# Patient Record
Sex: Female | Born: 1955 | Race: White | Hispanic: No | Smoking: Never smoker
Health system: Southern US, Community
[De-identification: ages and names within clinical notes are randomized; demographics above are authoritative.]

## PROBLEM LIST (undated history)

## (undated) DIAGNOSIS — R32 Unspecified urinary incontinence: Secondary | ICD-10-CM

## (undated) DIAGNOSIS — K635 Polyp of colon: Secondary | ICD-10-CM

## (undated) DIAGNOSIS — F319 Bipolar disorder, unspecified: Secondary | ICD-10-CM

## (undated) DIAGNOSIS — R7303 Prediabetes: Secondary | ICD-10-CM

## (undated) DIAGNOSIS — H269 Unspecified cataract: Secondary | ICD-10-CM

## (undated) DIAGNOSIS — M1990 Unspecified osteoarthritis, unspecified site: Secondary | ICD-10-CM

## (undated) DIAGNOSIS — I82409 Acute embolism and thrombosis of unspecified deep veins of unspecified lower extremity: Secondary | ICD-10-CM

## (undated) DIAGNOSIS — A630 Anogenital (venereal) warts: Secondary | ICD-10-CM

## (undated) DIAGNOSIS — K219 Gastro-esophageal reflux disease without esophagitis: Secondary | ICD-10-CM

## (undated) DIAGNOSIS — Z8371 Family history of colonic polyps: Secondary | ICD-10-CM

## (undated) DIAGNOSIS — F419 Anxiety disorder, unspecified: Secondary | ICD-10-CM

## (undated) DIAGNOSIS — A6000 Herpesviral infection of urogenital system, unspecified: Secondary | ICD-10-CM

## (undated) DIAGNOSIS — Z8719 Personal history of other diseases of the digestive system: Secondary | ICD-10-CM

## (undated) DIAGNOSIS — K5792 Diverticulitis of intestine, part unspecified, without perforation or abscess without bleeding: Secondary | ICD-10-CM

## (undated) DIAGNOSIS — C55 Malignant neoplasm of uterus, part unspecified: Secondary | ICD-10-CM

## (undated) DIAGNOSIS — F32A Depression, unspecified: Secondary | ICD-10-CM

## (undated) DIAGNOSIS — F329 Major depressive disorder, single episode, unspecified: Secondary | ICD-10-CM

## (undated) HISTORY — DX: Unspecified cataract: H26.9

## (undated) HISTORY — DX: Unspecified osteoarthritis, unspecified site: M19.90

## (undated) HISTORY — DX: Depression, unspecified: F32.A

## (undated) HISTORY — PX: COLONOSCOPY: SHX174

## (undated) HISTORY — PX: BUNIONECTOMY: SHX129

## (undated) HISTORY — PX: COLONOSCOPY W/ POLYPECTOMY: SHX1380

## (undated) HISTORY — DX: Bipolar disorder, unspecified: F31.9

## (undated) HISTORY — DX: Malignant neoplasm of uterus, part unspecified: C55

## (undated) HISTORY — DX: Unspecified urinary incontinence: R32

## (undated) HISTORY — DX: Anogenital (venereal) warts: A63.0

## (undated) HISTORY — DX: Anxiety disorder, unspecified: F41.9

## (undated) HISTORY — DX: Diverticulitis of intestine, part unspecified, without perforation or abscess without bleeding: K57.92

## (undated) HISTORY — PX: ABDOMINAL HYSTERECTOMY: SHX81

## (undated) HISTORY — DX: Family history of colonic polyps: Z83.71

## (undated) HISTORY — PX: TONSILLECTOMY AND ADENOIDECTOMY: SUR1326

## (undated) HISTORY — PX: KNEE ARTHROSCOPY: SUR90

## (undated) HISTORY — PX: OTHER SURGICAL HISTORY: SHX169

---
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
## 2011-12-14 DIAGNOSIS — N870 Mild cervical dysplasia: Secondary | ICD-10-CM

## 2011-12-14 HISTORY — DX: Mild cervical dysplasia: N87.0

## 2017-10-09 DIAGNOSIS — F3175 Bipolar disorder, in partial remission, most recent episode depressed: Secondary | ICD-10-CM | POA: Diagnosis not present

## 2017-11-14 DIAGNOSIS — Z1231 Encounter for screening mammogram for malignant neoplasm of breast: Secondary | ICD-10-CM | POA: Diagnosis not present

## 2017-12-05 DIAGNOSIS — Z1151 Encounter for screening for human papillomavirus (HPV): Secondary | ICD-10-CM | POA: Diagnosis not present

## 2017-12-05 DIAGNOSIS — A6000 Herpesviral infection of urogenital system, unspecified: Secondary | ICD-10-CM | POA: Diagnosis not present

## 2017-12-05 DIAGNOSIS — Z01419 Encounter for gynecological examination (general) (routine) without abnormal findings: Secondary | ICD-10-CM | POA: Diagnosis not present

## 2017-12-05 DIAGNOSIS — Z6829 Body mass index (BMI) 29.0-29.9, adult: Secondary | ICD-10-CM | POA: Diagnosis not present

## 2017-12-05 DIAGNOSIS — R87810 Cervical high risk human papillomavirus (HPV) DNA test positive: Secondary | ICD-10-CM | POA: Diagnosis not present

## 2017-12-11 DIAGNOSIS — Z131 Encounter for screening for diabetes mellitus: Secondary | ICD-10-CM | POA: Diagnosis not present

## 2017-12-11 DIAGNOSIS — R7989 Other specified abnormal findings of blood chemistry: Secondary | ICD-10-CM | POA: Diagnosis not present

## 2017-12-11 DIAGNOSIS — Z1329 Encounter for screening for other suspected endocrine disorder: Secondary | ICD-10-CM | POA: Diagnosis not present

## 2017-12-11 DIAGNOSIS — Z130 Encounter for screening for diseases of the blood and blood-forming organs and certain disorders involving the immune mechanism: Secondary | ICD-10-CM | POA: Diagnosis not present

## 2017-12-11 DIAGNOSIS — Z0000 Encounter for general adult medical examination without abnormal findings: Secondary | ICD-10-CM | POA: Diagnosis not present

## 2017-12-11 DIAGNOSIS — Z13220 Encounter for screening for lipoid disorders: Secondary | ICD-10-CM | POA: Diagnosis not present

## 2018-03-19 DIAGNOSIS — F3175 Bipolar disorder, in partial remission, most recent episode depressed: Secondary | ICD-10-CM | POA: Diagnosis not present

## 2018-05-03 DIAGNOSIS — M79641 Pain in right hand: Secondary | ICD-10-CM | POA: Diagnosis not present

## 2018-05-03 DIAGNOSIS — M545 Low back pain: Secondary | ICD-10-CM | POA: Diagnosis not present

## 2018-05-03 DIAGNOSIS — M25571 Pain in right ankle and joints of right foot: Secondary | ICD-10-CM | POA: Diagnosis not present

## 2018-05-03 DIAGNOSIS — M76821 Posterior tibial tendinitis, right leg: Secondary | ICD-10-CM | POA: Diagnosis not present

## 2018-06-03 DIAGNOSIS — M25571 Pain in right ankle and joints of right foot: Secondary | ICD-10-CM | POA: Diagnosis not present

## 2018-06-03 DIAGNOSIS — Z6829 Body mass index (BMI) 29.0-29.9, adult: Secondary | ICD-10-CM | POA: Diagnosis not present

## 2018-06-03 DIAGNOSIS — E663 Overweight: Secondary | ICD-10-CM | POA: Diagnosis not present

## 2018-06-03 DIAGNOSIS — Z713 Dietary counseling and surveillance: Secondary | ICD-10-CM | POA: Diagnosis not present

## 2018-06-03 DIAGNOSIS — E039 Hypothyroidism, unspecified: Secondary | ICD-10-CM | POA: Diagnosis not present

## 2018-06-03 DIAGNOSIS — Z0000 Encounter for general adult medical examination without abnormal findings: Secondary | ICD-10-CM | POA: Diagnosis not present

## 2018-07-23 ENCOUNTER — Ambulatory Visit: Payer: BLUE CROSS/BLUE SHIELD | Admitting: Podiatry

## 2018-07-23 ENCOUNTER — Ambulatory Visit (INDEPENDENT_AMBULATORY_CARE_PROVIDER_SITE_OTHER): Payer: BLUE CROSS/BLUE SHIELD

## 2018-07-23 DIAGNOSIS — M2142 Flat foot [pes planus] (acquired), left foot: Secondary | ICD-10-CM | POA: Diagnosis not present

## 2018-07-23 DIAGNOSIS — M76821 Posterior tibial tendinitis, right leg: Secondary | ICD-10-CM | POA: Diagnosis not present

## 2018-07-23 DIAGNOSIS — M2141 Flat foot [pes planus] (acquired), right foot: Secondary | ICD-10-CM | POA: Diagnosis not present

## 2018-07-23 DIAGNOSIS — M779 Enthesopathy, unspecified: Secondary | ICD-10-CM

## 2018-07-23 NOTE — Patient Instructions (Signed)
Posterior Tibialis Tendinosis Rehab Ask your health care provider which exercises are safe for you. Do exercises exactly as told by your health care provider and adjust them as directed. It is normal to feel mild stretching, pulling, tightness, or discomfort as you do these exercises, but you should stop right away if you feel sudden pain or your pain gets worse.Do not begin these exercises until told by your health care provider. Stretching and range of motion exercises These exercises warm up your muscles and joints and improve the movement and flexibility in your ankle and foot. These exercises may also help to relieve pain. Exercise A: Standing wall calf stretch, knee straight  1. Stand with your hands against a wall. 2. Extend your __________ leg behind you, and bend your front knee slightly. Keep both of your heels on the floor. 3. Point the toes of your back foot slightly inward. 4. Keeping your heels on the floor and your back knee straight, shift your weight toward the wall. Do not allow your back to arch. You should feel a gentle stretch in the back of your lower leg (calf). 5. Hold this position for __________ seconds. Repeat __________ times. Complete this stretch __________ times a day. Exercise B: Standing wall calf stretch, knee bent 1. Stand with your hands against a wall. 2. Extend your __________ leg behind you, and bend your front knee slightly. Keep both of your heels on the floor. 3. Point the toes of your back foot slightly inward. 4. Unlock your back knee so it is bent. Keep your heels on the floor. You should feel a gentle stretch deep in your calf. 5. Hold this position for __________ seconds. Repeat __________ times. Complete this exercise __________ times a day. Strengthening exercises These exercises build strength and endurance in your ankle and foot. Endurance is the ability to use your muscles for a long time, even after they get tired. Exercise C: Ankle inversion  with band 1. Secure one end of an exercise band or tubing to a fixed object, such as a table leg or a pole, that will stay still when the band is pulled. to an object that will not move if it is pulled on, like a table leg. 2. Loop the other end of the band around the middle of your left / right foot. 3. Sit on the floor facing the object with your __________ leg extended. The band or tube should be slightly tense when your foot is relaxed. 4. Leading with your big toe, slowly bring your __________ foot and ankle inward, toward your other foot. 5. Hold this position for __________ seconds. 6. Slowly return your foot to the starting position. Repeat __________ times. Complete this exercise __________ times a day. Exercise D: Towel curls  1. Sit in a chair on a non-carpeted surface, and put your feet on the floor. 2. Place a towel in front of your feet. If told by your health care provider, add __________ at the end of the towel. 3. Keeping your heel on the floor, put your __________ foot on the towel. 4. Pull the towel toward you by grabbing the towel with your toes and curling them under. Keep your heel on the floor. 5. Let your toes relax. 6. Grab the towel with your toes again. Keep going until the towel is completely underneath your foot. Repeat __________ times. Complete this exercise __________ times a day. Balance exercises These exercises improve or maintain your balance. Balance is important in preventing falls.  Exercise E: Single leg stand 1. Without shoes, stand near a railing or in a doorway. You can hold on to the railing or door frame as needed for balance. 2. Stand on your __________ foot. Keep your big toe down on the floor and try to keep your arch lifted. If balancing in this position is too easy, try the exercise with your eyes closed or while standing on a pillow. 3. Hold this position for __________ seconds. Repeat __________ times. Complete this exercise __________ times a  day. This information is not intended to replace advice given to you by your health care provider. Make sure you discuss any questions you have with your health care provider. Document Released: 10/30/2005 Document Revised: 07/04/2016 Document Reviewed: 07/16/2015 Elsevier Interactive Patient Education  [employer].

## 2018-07-24 DIAGNOSIS — M76821 Posterior tibial tendinitis, right leg: Secondary | ICD-10-CM | POA: Insufficient documentation

## 2018-07-24 NOTE — Progress Notes (Signed)
Subjective:   Patient ID: Summer Craig, female   DOB: 62 y.o.   MRN: [id number]   HPI 62 year old female presents the office with concerns of right ankle, foot pain which is been ongoing for last several months.  She previously saw another physician for this and she was put into a walking boot as well as anti-inflammatories which did help however she feels that the problem does continue to some degree the area is not fixed.  She states that she gets pain mostly into most of her ankle, pointing more along the navicular tuberosity.  She gets occasional swelling to the area.  She denies any recent injury or trauma to her feet.  She is been resting, icing and elevating as well and this is been helping with the swelling.  She has no numbness or tingling.  She has no other concerns today.   Review of Systems  All other systems reviewed and are negative.  No past medical history on file.     Current Outpatient Medications:  .  clorazepate (TRANXENE-T) 7.5 MG tablet, Take by mouth., Disp: , Rfl:  .  doxycycline (ORACEA) 40 MG capsule, TK 1 C PO D, Disp: , Rfl:  .  meperidine (DEMEROL) 50 MG tablet, Take by mouth., Disp: , Rfl:  .  metroNIDAZOLE (METROCREAM) 0.75 % cream, Apply to entire face qhs, Disp: , Rfl:  .  promethazine (PHENERGAN) 25 MG tablet, Take by mouth., Disp: , Rfl:  .  fluvoxaMINE (LUVOX) 50 MG tablet, TK 2 TS PO QAM AND 1 T QPM, Disp: , Rfl: 1 .  folic acid (FOLVITE) 1 MG tablet, TK 1 T PO D, Disp: , Rfl: 2 .  lamoTRIgine (LAMICTAL) 200 MG tablet, TK 1 T PO ONCE A DAY, Disp: , Rfl: 1 .  meloxicam (MOBIC) 15 MG tablet, TK 1 T PO QD WF PRN, Disp: , Rfl: 0 .  metFORMIN (GLUCOPHAGE-XR) 500 MG 24 hr tablet, TAKE ONE TABLET BID, Disp: , Rfl: 2 .  OLANZapine (ZYPREXA) 2.5 MG tablet, TK 1 T PO Q NIGHT, Disp: , Rfl: 0 .  OLANZapine (ZYPREXA) 5 MG tablet, Take by mouth., Disp: , Rfl:   Allergies  Allergen Reactions  . No Known Allergies           Objective:  Physical Exam   General: AAO x3, NAD  Dermatological: Skin is warm, dry and supple bilateral. Nails x 10 are well manicured; remaining integument appears unremarkable at this time. There are no open sores, no preulcerative lesions, no rash or signs of infection present.  Vascular: Dorsalis Pedis artery and Posterior Tibial artery pedal pulses are 2/4 bilateral with immedate capillary fill time. Pedal hair growth present. No varicosities and no lower extremity edema present bilateral. There is no pain with calf compression, swelling, warmth, erythema.   Neruologic: Grossly intact via light touch bilateral.  Protective threshold with Semmes Wienstein monofilament intact to all pedal sites bilateral.  Negative Tinel sign.  Musculoskeletal: There is a decrease in medial arch upon weightbearing.  There is tenderness to the distal portion of the posterior tibial tendon along insertion into the navicular tuberosity.  Tendon appears to be intact.  She has some difficulty with single heel rise on the right side but she is able to do it.  There is no pain or restriction of ankle and subtalar joint range of motion.  Achilles tendon, flexor, extensor tendons intact.  Muscular strength 5/5 in all groups tested bilateral.  Gait: Unassisted, Nonantalgic.  Assessment:   Posterior tibial tendinitis, flatfoot deformity     Plan:  -Treatment options discussed including all alternatives, risks, and complications -Etiology of symptoms were discussed -X-rays were obtained and reviewed with the patient.  Evidence of flatfoot deformity is present.  There is no evidence of acute fracture identified today. -At this point it appears that the acute inflammation is improving.  We discussed more long-term options for her.  In the short-term I want her to continue with stretching, rehab exercises we discussed as well as anti-inflammatories and ice the area.  Long-term I think is in the benefit well from a custom molded orthotic and  she wished to proceed with this.  Rick evaluate her today she was molded for inserts.  We discussed shoe modifications as well.  She agrees with this plan has no further questions or concerns.  If symptoms continue discussed MRI.  Trula Slade DPM

## 2018-07-25 ENCOUNTER — Encounter: Payer: Self-pay | Admitting: Registered"

## 2018-07-25 ENCOUNTER — Encounter: Payer: BLUE CROSS/BLUE SHIELD | Attending: Obstetrics | Admitting: Registered"

## 2018-07-25 DIAGNOSIS — Z6829 Body mass index (BMI) 29.0-29.9, adult: Secondary | ICD-10-CM | POA: Insufficient documentation

## 2018-07-25 DIAGNOSIS — E663 Overweight: Secondary | ICD-10-CM | POA: Diagnosis not present

## 2018-07-25 DIAGNOSIS — Z713 Dietary counseling and surveillance: Secondary | ICD-10-CM | POA: Diagnosis not present

## 2018-07-25 NOTE — Progress Notes (Signed)
Medical Nutrition Therapy:  Appt start time: 0920 end time:  1025.  Assessment:  Primary concerns today: Pt referred for weight management. Pt reports she is on Metformin to help reduce weight gain associated with other medications she takes as well as help with managing blood sugar per pt. Pt reports that she has been making some dietary changes recently. Reports drinking at least 64 oz of water per day, eating 3 meals per day, reducing portions, and working to have more balanced meals. Pt has been including more yogurt and having sweets in moderation. Pt has lost around 10 lb over a little over the past month. Pt reports she has started being more thoughtful about what she eats. Pt reports that eating a lot of bread, pasta causes her stomach to feel "on fire" as well as causing increased acid reflux. She reports that whether or not she has this outcome depends on amount of bread or pasta she consumes. Pt reports having increased acid reflux after drinking wine as well. Denies having this feeling with any other types of food. Pt denies any concerns about her energy level. Reports she has plenty of energy.   Preferred Learning Style:   No preference indicated   Learning Readiness:   Ready  MEDICATIONS: See list.    DIETARY INTAKE:  Usual eating pattern includes 3 meals and may have 1 snack per day. Meals eaten at home are eaten either in kitchen or sunroom and no electronics are present at meal times.   Everyday foods include Honey Nut Cheerios and 2% milk.  Avoided foods include [place] yogurt (likes regular yogurt). Pt reports that she does not like low or fat free milk.   24-hr recall: Typical Day  B ( AM): Honey Nut Cheerios, 2% milk, 1 cup of coffee  Snk ( AM): 32 oz water  L ( PM): salad with vinaigrette OR ice burg lettuce, blue cheese, blue cheese dressing, grapes.   Snk ( PM): may sometimes have yogurt; 32 oz water D (630-730 PM): sauteed vegetables, a meat (often chicken or  seafood)-no starch usually  Snk ( PM): small serving of ice cream  Beverages: 64 oz water  Usual physical activity:  Pt reports she has not been walking for activity due to tendonitis in foot. Pt has seen a podiatrist. When asked about doing chair exercises pt responded that she wants to start back taking chair yoga and was very agreeable to trying chair exercises.   Progress Towards Goal(s):  In progress.   Nutritional Diagnosis:  NB-1.1 Food and nutrition-related knowledge deficit As related to balanced nutrition.  As evidenced by pt has questions regarding how she should eat to promote health/weight loss; pt's reported dietary recall and habits.    Intervention:  Nutrition counseling provided. Dietitian provided education regarding balanced nutrition. Praised pt's progress with getting in more consistent meals, being more mindful of dietary intake, and staying well hydrated. Dietitian provided education regarding celiac disease/gluten intolerance. Discussed gluten free grains as pt reports that wheat bread and pasta cause GI pain/reflux. Encouraged pt to talk with her doctor about being tested for celiac disease. Pt reports that she would be willing to have blood work done but is not sure about having an endoscopy if it was recommended. Pt reports she would be willing to follow a gluten-free diet if doing so helps with reducing her symptoms. Encouraged pt to talk with doctor about test recommendations first as tests may not be accurate if pt starts following a gluten  free diet beforehand. Encouraged her to follow-up if she has any questions about following a gluten free diet. Encouraged pt to try chair exercises while unable to include walking due to foot pain. Pt appeared agreeable to information/goals discussed.   Instructions/Goals:  Make sure to get in three meals per day. Have balanced meals like the My Plate example (see handout).  Continue getting in at least 64 oz of water per day.    Make physical activity a part of your week. Try to include at least 30 minutes of physical activity 5 days each week or at least 150 minutes per week. Regular physical activity promotes overall health-including helping to reduce risk for heart disease and diabetes, promoting mental health, and helping [place] sleep better.    Include appropriate physical activities as able. Recommend including the chair activities while unable to include walking.   Recommend asking your doctor about testing for celiac disease due to described adverse symptoms following ingestion of wheat products (bread and pasta).   For more information about celiac disease:   [email or web address]  Teaching Method Utilized: Visual Auditory  Handouts given during visit include:  Balanced plate and food list.   Chair Exercises   Barriers to learning/adherence to lifestyle change: None indicated.   Demonstrated degree of understanding via:  Teach Back   Monitoring/Evaluation:  Dietary intake, exercise, and body weight prn. Contact information given.

## 2018-07-25 NOTE — Patient Instructions (Addendum)
Instructions/Goals:  Make sure to get in three meals per day. Have balanced meals like the My Plate example (see handout).  Continue getting in at least 64 oz of water per day.   Make physical activity a part of your week. Try to include at least 30 minutes of physical activity 5 days each week or at least 150 minutes per week. Regular physical activity promotes overall health-including helping to reduce risk for heart disease and diabetes, promoting mental health, and helping [place] sleep better.    Include appropriate physical activities as able. Recommend including the chair activities while unable to include walking.   Recommend asking your doctor about testing for celiac disease due to described adverse symptoms following ingestion of wheat products (bread and pasta).   For more information about celiac disease:   [email or web address]

## 2018-08-13 ENCOUNTER — Encounter: Payer: BLUE CROSS/BLUE SHIELD | Admitting: Orthotics

## 2018-08-19 DIAGNOSIS — H43393 Other vitreous opacities, bilateral: Secondary | ICD-10-CM | POA: Diagnosis not present

## 2018-08-20 ENCOUNTER — Encounter: Payer: BLUE CROSS/BLUE SHIELD | Admitting: Orthotics

## 2018-08-25 DIAGNOSIS — R5383 Other fatigue: Secondary | ICD-10-CM | POA: Diagnosis not present

## 2018-08-27 ENCOUNTER — Ambulatory Visit: Payer: BLUE CROSS/BLUE SHIELD | Admitting: Orthotics

## 2018-08-27 ENCOUNTER — Encounter: Payer: BLUE CROSS/BLUE SHIELD | Admitting: Orthotics

## 2018-08-27 DIAGNOSIS — M76821 Posterior tibial tendinitis, right leg: Secondary | ICD-10-CM

## 2018-08-27 DIAGNOSIS — M2142 Flat foot [pes planus] (acquired), left foot: Secondary | ICD-10-CM

## 2018-08-27 DIAGNOSIS — M2141 Flat foot [pes planus] (acquired), right foot: Secondary | ICD-10-CM

## 2018-09-16 DIAGNOSIS — Z713 Dietary counseling and surveillance: Secondary | ICD-10-CM | POA: Diagnosis not present

## 2018-09-16 DIAGNOSIS — Z6829 Body mass index (BMI) 29.0-29.9, adult: Secondary | ICD-10-CM | POA: Diagnosis not present

## 2018-09-16 DIAGNOSIS — E663 Overweight: Secondary | ICD-10-CM | POA: Diagnosis not present

## 2018-09-18 DIAGNOSIS — F3175 Bipolar disorder, in partial remission, most recent episode depressed: Secondary | ICD-10-CM | POA: Diagnosis not present

## 2018-10-20 NOTE — Progress Notes (Signed)
Patient came in today to pick up custom made foot orthotics.  The goals were accomplished and the patient reported no dissatisfaction with said orthotics.  Patient was advised of breakin period and how to report any issues. 

## 2018-12-19 DIAGNOSIS — M1712 Unilateral primary osteoarthritis, left knee: Secondary | ICD-10-CM | POA: Diagnosis not present

## 2018-12-25 DIAGNOSIS — L659 Nonscarring hair loss, unspecified: Secondary | ICD-10-CM | POA: Diagnosis not present

## 2018-12-25 DIAGNOSIS — L821 Other seborrheic keratosis: Secondary | ICD-10-CM | POA: Diagnosis not present

## 2018-12-25 DIAGNOSIS — D229 Melanocytic nevi, unspecified: Secondary | ICD-10-CM | POA: Diagnosis not present

## 2018-12-31 DIAGNOSIS — Z1231 Encounter for screening mammogram for malignant neoplasm of breast: Secondary | ICD-10-CM | POA: Diagnosis not present

## 2018-12-31 DIAGNOSIS — Z13220 Encounter for screening for lipoid disorders: Secondary | ICD-10-CM | POA: Diagnosis not present

## 2018-12-31 DIAGNOSIS — Z0000 Encounter for general adult medical examination without abnormal findings: Secondary | ICD-10-CM | POA: Diagnosis not present

## 2018-12-31 DIAGNOSIS — Z6827 Body mass index (BMI) 27.0-27.9, adult: Secondary | ICD-10-CM | POA: Diagnosis not present

## 2018-12-31 DIAGNOSIS — Z131 Encounter for screening for diabetes mellitus: Secondary | ICD-10-CM | POA: Diagnosis not present

## 2018-12-31 DIAGNOSIS — Z01419 Encounter for gynecological examination (general) (routine) without abnormal findings: Secondary | ICD-10-CM | POA: Diagnosis not present

## 2018-12-31 DIAGNOSIS — Z130 Encounter for screening for diseases of the blood and blood-forming organs and certain disorders involving the immune mechanism: Secondary | ICD-10-CM | POA: Diagnosis not present

## 2018-12-31 DIAGNOSIS — Z1329 Encounter for screening for other suspected endocrine disorder: Secondary | ICD-10-CM | POA: Diagnosis not present

## 2019-03-19 DIAGNOSIS — F3175 Bipolar disorder, in partial remission, most recent episode depressed: Secondary | ICD-10-CM | POA: Diagnosis not present

## 2019-03-19 DIAGNOSIS — R21 Rash and other nonspecific skin eruption: Secondary | ICD-10-CM | POA: Diagnosis not present

## 2019-04-18 DIAGNOSIS — H16223 Keratoconjunctivitis sicca, not specified as Sjogren's, bilateral: Secondary | ICD-10-CM | POA: Diagnosis not present

## 2019-06-20 DIAGNOSIS — Z20828 Contact with and (suspected) exposure to other viral communicable diseases: Secondary | ICD-10-CM | POA: Diagnosis not present

## 2019-08-11 ENCOUNTER — Encounter (HOSPITAL_COMMUNITY): Payer: Self-pay | Admitting: Emergency Medicine

## 2019-08-11 ENCOUNTER — Other Ambulatory Visit: Payer: Self-pay

## 2019-08-11 ENCOUNTER — Emergency Department (HOSPITAL_COMMUNITY)
Admission: EM | Admit: 2019-08-11 | Discharge: 2019-08-12 | Disposition: A | Discharge status: Home / Self Care | Payer: BLUE CROSS/BLUE SHIELD | Attending: Emergency Medicine | Admitting: Emergency Medicine

## 2019-08-11 ENCOUNTER — Ambulatory Visit (INDEPENDENT_AMBULATORY_CARE_PROVIDER_SITE_OTHER)
Admission: EM | Admit: 2019-08-11 | Discharge: 2019-08-11 | Disposition: A | Discharge status: Home / Self Care | Payer: BLUE CROSS/BLUE SHIELD | Source: Home / Self Care | Attending: Family Medicine | Admitting: Family Medicine

## 2019-08-11 ENCOUNTER — Encounter (HOSPITAL_COMMUNITY): Payer: Self-pay | Admitting: *Deleted

## 2019-08-11 DIAGNOSIS — R1032 Left lower quadrant pain: Secondary | ICD-10-CM

## 2019-08-11 DIAGNOSIS — K5792 Diverticulitis of intestine, part unspecified, without perforation or abscess without bleeding: Secondary | ICD-10-CM | POA: Insufficient documentation

## 2019-08-11 DIAGNOSIS — Z79899 Other long term (current) drug therapy: Secondary | ICD-10-CM | POA: Diagnosis not present

## 2019-08-11 DIAGNOSIS — K5732 Diverticulitis of large intestine without perforation or abscess without bleeding: Secondary | ICD-10-CM | POA: Diagnosis not present

## 2019-08-11 LAB — URINALYSIS, ROUTINE W REFLEX MICROSCOPIC
Bilirubin Urine: NEGATIVE
Glucose, UA: NEGATIVE mg/dL
Ketones, ur: NEGATIVE mg/dL
Leukocytes,Ua: NEGATIVE
Nitrite: NEGATIVE
Protein, ur: NEGATIVE mg/dL
Specific Gravity, Urine: 1.003 — ABNORMAL LOW (ref 1.005–1.030)
pH: 6 (ref 5.0–8.0)

## 2019-08-11 LAB — COMPREHENSIVE METABOLIC PANEL
ALT: 17 U/L (ref 0–44)
AST: 18 U/L (ref 15–41)
Albumin: 4.1 g/dL (ref 3.5–5.0)
Alkaline Phosphatase: 80 U/L (ref 38–126)
Anion gap: 10 (ref 5–15)
BUN: 8 mg/dL (ref 8–23)
CO2: 26 mmol/L (ref 22–32)
Calcium: 9.5 mg/dL (ref 8.9–10.3)
Chloride: 99 mmol/L (ref 98–111)
Creatinine, Ser: 0.79 mg/dL (ref 0.44–1.00)
GFR calc Af Amer: >60 mL/min (ref >60)
GFR calc non Af Amer: >60 mL/min (ref >60)
Glucose, Bld: 114 mg/dL — ABNORMAL HIGH (ref 70–99)
Potassium: 3.8 mmol/L (ref 3.5–5.1)
Sodium: 135 mmol/L (ref 135–145)
Total Bilirubin: 0.7 mg/dL (ref 0.3–1.2)
Total Protein: 7.2 g/dL (ref 6.5–8.1)

## 2019-08-11 LAB — POCT URINALYSIS DIP (DEVICE)
Bilirubin Urine: NEGATIVE
Glucose, UA: NEGATIVE mg/dL
Ketones, ur: NEGATIVE mg/dL
Nitrite: NEGATIVE
Protein, ur: NEGATIVE mg/dL
Specific Gravity, Urine: 1.01 (ref 1.005–1.030)
Urobilinogen, UA: 0.2 mg/dL (ref 0.0–1.0)
pH: 6 (ref 5.0–8.0)

## 2019-08-11 LAB — CBC
HCT: 36.8 % (ref 36.0–46.0)
Hemoglobin: 12.3 g/dL (ref 12.0–15.0)
MCH: 31.5 pg (ref 26.0–34.0)
MCHC: 33.4 g/dL (ref 30.0–36.0)
MCV: 94.1 fL (ref 80.0–100.0)
Platelets: 270 10*3/uL (ref 150–400)
RBC: 3.91 MIL/uL (ref 3.87–5.11)
RDW: 13.1 % (ref 11.5–15.5)
WBC: 8.5 10*3/uL (ref 4.0–10.5)
nRBC: 0 % (ref 0.0–0.2)

## 2019-08-11 LAB — LIPASE, BLOOD: Lipase: 34 U/L (ref 11–51)

## 2019-08-11 MED ORDER — ACETAMINOPHEN 325 MG PO TABS
650.0000 mg | ORAL_TABLET | Freq: Once | ORAL | Status: AC
  Administered 2019-08-11: 650 mg via ORAL

## 2019-08-11 MED ORDER — ACETAMINOPHEN 325 MG PO TABS
ORAL_TABLET | ORAL | Status: AC
  Filled 2019-08-11: qty 2

## 2019-08-11 MED ORDER — SODIUM CHLORIDE 0.9% FLUSH: 3.0000 mL | Freq: Once | INTRAVENOUS | Status: DC

## 2019-08-11 NOTE — ED Triage Notes (Signed)
Pt reports LLQ pain onset last night with tenderness on palpation. Denies dysuria . Was seen at [facility] for same and sent here for further eval. Reports temp at home, given tylenol at [facility]

## 2019-08-11 NOTE — ED Triage Notes (Addendum)
Pt states she went to get out of her car last night to go to the bathroom and she had sudden onset of sharp pain in her LLQ that kept her from standing up straight.  Pt states that by this morning the pain had started to radiate to her RLQ but the pain is mainly in her LLQ.  She started feeling chills this afternoon and took her temperature at home and it was 100.1.  She is afebrile here today at 99.2.  She denies any N, V, or D.

## 2019-08-11 NOTE — ED Provider Notes (Signed)
[facility]    CSN: [id number] Arrival date & time: 08/11/19  1646      History   Chief Complaint Chief Complaint  Patient presents with   Abdominal Pain   Fever    HPI Summer Craig is a 63 y.o. female.   Summer Craig presents with complaints of abdominal cramping, primarily to LLQ. Started last night when she got out of her car, felt the cramping and some sharp pain to LLQ. Today has worsened. Worse if she stands straight. No nausea vomiting or diarrhea. Noted a temp today of 100. On arrival here 99.2. still with pain, approximately 8/10. No urinary symptoms. Denies any previous similar. No previous abdominal surgeries. Has been eating and drinking normally. Pain worsens if she stands straight, she feels she needs to bend over to help with her pain. Had a BM yesterday and today, this temporarily relieves the pain but didn't eradicate it. States she feels her last colonoscopy was greater than 5 years ago, she did have polyps. No known blood or black in stools. History of arthritis, bipolar. Moved here approximately 2 years ago and hasn't established with a local PCP since. Was just in [place] visiting her daughter and was at a wedding. No URI symptoms.     ROS per HPI, negative if not otherwise mentioned.      Past Medical History:  Diagnosis Date   Arthritis    Bipolar disorder [facility])     Patient Active Problem List   Diagnosis Date Noted   Posterior tibial tendonitis of right leg 07/24/2018    History reviewed. No pertinent surgical history.  OB History   No obstetric history on file.      Home Medications    Prior to Admission medications   Medication Sig Start Date End Date Taking? Authorizing Provider  clorazepate (TRANXENE-T) 7.5 MG tablet Take by mouth. 08/24/08  Yes [provider]  doxycycline (ORACEA) 40 MG capsule TK 1 C PO D 04/25/15  Yes [provider]  fluvoxaMINE (LUVOX) 50 MG tablet TK 2 TS PO QAM AND 1 T  QPM 04/15/18  Yes [provider]  folic acid (FOLVITE) 1 MG tablet TK 1 T PO D 06/03/18  Yes [provider]  lamoTRIgine (LAMICTAL) 200 MG tablet TK 1 T PO ONCE A DAY 05/01/18  Yes [provider]  metFORMIN (GLUCOPHAGE-XR) 500 MG 24 hr tablet TAKE ONE TABLET BID 07/01/18  Yes [provider]  OLANZapine (ZYPREXA) 2.5 MG tablet TK 1 T PO Q NIGHT 07/13/18  Yes [provider]  meloxicam (MOBIC) 15 MG tablet TK 1 T PO QD WF PRN 07/01/18   [provider]  meperidine (DEMEROL) 50 MG tablet Take by mouth. 08/24/08   [provider]  metroNIDAZOLE (METROCREAM) 0.75 % cream Apply to entire face qhs 03/03/14   [provider]  OLANZapine (ZYPREXA) 5 MG tablet Take by mouth.    [provider]  promethazine (PHENERGAN) 25 MG tablet Take by mouth. 08/24/08   [provider]    Family History Family History  Problem Relation Age of Onset   Sleep apnea Mother     Social History Social History   Tobacco Use   Smoking status: Not on file  Substance Use Topics   Alcohol use: Yes   Drug use: Never     Allergies   No known allergies   Review of Systems Review of Systems   Physical Exam Triage Vital Signs ED Triage Vitals  Enc Vitals Group     BP 08/11/19 1713 132/72     Pulse Rate 08/11/19 1713 (!) 101     Resp 08/11/19 1713 16     Temp 08/11/19 1713 99.2 F (37.3 C)     Temp Source 08/11/19 1713 Oral     SpO2 08/11/19 1713 100 %     Weight --      Height --      Head Circumference --      Peak Flow --      Pain Score 08/11/19 1733 8     Pain Loc --      Pain Edu? --      Excl. in [place]? --    No data found.  Updated Vital Signs BP 132/72 (BP Location: Left Arm)    Pulse (!) 101    Temp 99.2 F (37.3 C) (Oral)    Resp 16    SpO2 100%    Physical Exam Constitutional:      General: She is not in acute distress.    Appearance: She is well-developed.  Cardiovascular:     Rate and  Rhythm: Tachycardia present.  Pulmonary:     Effort: Pulmonary effort is normal.  Abdominal:     Tenderness: There is abdominal tenderness in the suprapubic area and left lower quadrant.  Skin:    General: Skin is warm and dry.  Neurological:     Mental Status: She is alert and oriented to person, place, and time.      UC Treatments / Results  Labs (all labs ordered are listed, but only abnormal results are displayed) Labs Reviewed  POCT URINALYSIS DIP (DEVICE) - Abnormal; Notable for the following components:      Result Value   Hgb urine dipstick SMALL (*)    Leukocytes,Ua TRACE (*)    All other components within normal limits  URINE CULTURE    EKG   Radiology No results found.  Procedures Procedures (including critical care time)  Medications Ordered in UC Medications  acetaminophen (TYLENOL) tablet 650 mg (650 mg Oral Given 08/11/19 1743)  acetaminophen (TYLENOL) 325 MG tablet (has no administration in time range)    Initial Impression / Assessment and Plan / UC Course  I have reviewed the triage vital signs and the nursing notes.  Pertinent labs & imaging results that were available during my care of the patient were reviewed by me and considered in my medical decision making (see chart for details).     Small hgb and trace leuks to urine, no urinary symptoms and quite tender abdomen with mild tachycardia and low grade temps. Culture sent with urine. Discussed options for work up, as well as limitations of urgent care evaluation, patient states she would prefer more thorough/efficient evaluation in the ER. UTI vs diverticulitis discussed and considered. Patient is self transporting to the ER.   Final Clinical Impressions(s) / UC Diagnoses   Final diagnoses:  LLQ abdominal pain   Discharge Instructions   None    ED Prescriptions    None     PDMP not reviewed this encounter.   Zigmund Gottron, NP 08/11/19 [phone]

## 2019-08-12 ENCOUNTER — Encounter (HOSPITAL_COMMUNITY): Payer: Self-pay | Admitting: Radiology

## 2019-08-12 ENCOUNTER — Emergency Department (HOSPITAL_COMMUNITY): Payer: BLUE CROSS/BLUE SHIELD

## 2019-08-12 DIAGNOSIS — K5732 Diverticulitis of large intestine without perforation or abscess without bleeding: Secondary | ICD-10-CM | POA: Diagnosis not present

## 2019-08-12 LAB — URINE CULTURE

## 2019-08-12 MED ORDER — HYDROCODONE-ACETAMINOPHEN 5-325 MG PO TABS: 1.0000 | ORAL_TABLET | Freq: Three times a day (TID) | ORAL | 0 refills | Status: DC | PRN

## 2019-08-12 MED ORDER — ONDANSETRON 4 MG PO TBDP: 4.0000 mg | ORAL_TABLET | Freq: Three times a day (TID) | ORAL | 0 refills | Status: AC | PRN

## 2019-08-12 MED ORDER — ACETAMINOPHEN 500 MG PO TABS
1000.0000 mg | ORAL_TABLET | Freq: Once | ORAL | Status: AC
  Administered 2019-08-12: 05:00:00 1000 mg via ORAL
  Filled 2019-08-12: qty 2

## 2019-08-12 MED ORDER — ACETAMINOPHEN 500 MG PO TABS: 1000.0000 mg | ORAL_TABLET | Freq: Three times a day (TID) | ORAL | 0 refills | Status: AC

## 2019-08-12 MED ORDER — HYDROCODONE-ACETAMINOPHEN 5-325 MG PO TABS: 1.0000 | ORAL_TABLET | Freq: Three times a day (TID) | ORAL | 0 refills | Status: AC | PRN

## 2019-08-12 MED ORDER — AMOXICILLIN-POT CLAVULANATE 875-125 MG PO TABS
1.0000 | ORAL_TABLET | Freq: Once | ORAL | Status: AC
  Administered 2019-08-12: 05:00:00 1 via ORAL
  Filled 2019-08-12: qty 1

## 2019-08-12 MED ORDER — AMOXICILLIN-POT CLAVULANATE 875-125 MG PO TABS: 1.0000 | ORAL_TABLET | Freq: Two times a day (BID) | ORAL | 0 refills | Status: AC

## 2019-08-12 NOTE — ED Provider Notes (Signed)
[facility] EMERGENCY DEPARTMENT Provider Note  CSN: [id number] Arrival date & time: 08/11/19 1856  Chief Complaint(s) Abdominal Pain  HPI Summer Craig is a 63 y.o. female who presents for 1 day of left lower quadrant abdominal pain.  Pain crampy in nature with some sharp components.  Gradually worsened throughout the day.  Worse with movement and ambulation.  No nausea or vomiting.  Low-grade fever.  No diarrhea.  Denies any other physical complaints.  Patient seen at urgent care and sent here for further evaluation.  HPI  Past Medical History Past Medical History:  Diagnosis Date   Arthritis    Bipolar disorder [facility])    Patient Active Problem List   Diagnosis Date Noted   Posterior tibial tendonitis of right leg 07/24/2018   Home Medication(s) Prior to Admission medications   Medication Sig Start Date End Date Taking? Authorizing Provider  acetaminophen (TYLENOL) 500 MG tablet Take 2 tablets (1,000 mg total) by mouth every 8 (eight) hours for 5 days. Do not take more than 4000 mg of acetaminophen (Tylenol) in a 24-hour period. Please note that other medicines that you may be prescribed may have Tylenol as well. 08/12/19 08/17/19  Fatima Blank, MD  amoxicillin-clavulanate (AUGMENTIN) 875-125 MG tablet Take 1 tablet by mouth every 12 (twelve) hours for 14 days. 08/12/19 08/26/19  Fatima Blank, MD  clorazepate (TRANXENE-T) 7.5 MG tablet Take by mouth. 08/24/08   [provider]  doxycycline (ORACEA) 40 MG capsule TK 1 C PO D 04/25/15   [provider]  fluvoxaMINE (LUVOX) 50 MG tablet TK 2 TS PO QAM AND 1 T QPM 04/15/18   [provider]  folic acid (FOLVITE) 1 MG tablet TK 1 T PO D 06/03/18   [provider]  HYDROcodone-acetaminophen (NORCO/VICODIN) 5-325 MG tablet Take 1 tablet by mouth every 8 (eight) hours as needed for up to 5 days for severe pain (That is not improved by your scheduled acetaminophen  regimen). Please do not exceed 4000 mg of acetaminophen (Tylenol) a 24-hour period. Please note that he may be prescribed additional medicine that contains acetaminophen. 08/12/19 08/17/19  Fatima Blank, MD  lamoTRIgine (LAMICTAL) 200 MG tablet TK 1 T PO ONCE A DAY 05/01/18   [provider]  meloxicam (MOBIC) 15 MG tablet TK 1 T PO QD WF PRN 07/01/18   [provider]  meperidine (DEMEROL) 50 MG tablet Take by mouth. 08/24/08   [provider]  metFORMIN (GLUCOPHAGE-XR) 500 MG 24 hr tablet TAKE ONE TABLET BID 07/01/18   [provider]  metroNIDAZOLE (METROCREAM) 0.75 % cream Apply to entire face qhs 03/03/14   [provider]  OLANZapine (ZYPREXA) 2.5 MG tablet TK 1 T PO Q NIGHT 07/13/18   [provider]  OLANZapine (ZYPREXA) 5 MG tablet Take by mouth.    [provider]  ondansetron (ZOFRAN ODT) 4 MG disintegrating tablet Take 1 tablet (4 mg total) by mouth every 8 (eight) hours as needed for up to 3 days for nausea or vomiting. 08/12/19 08/15/19  Fatima Blank, MD  promethazine (PHENERGAN) 25 MG tablet Take by mouth. 08/24/08   [provider]  Past Surgical History History reviewed. No pertinent surgical history. Family History Family History  Problem Relation Age of Onset   Sleep apnea Mother     Social History Social History   Tobacco Use   Smoking status: Never Smoker   Smokeless tobacco: Never Used  Substance Use Topics   Alcohol use: Yes   Drug use: Never   Allergies No known allergies  Review of Systems Review of Systems All other systems are reviewed and are negative for acute change except as noted in the HPI  Physical Exam Vital Signs  I have reviewed the triage vital signs BP 129/70 (BP Location: Right Arm)    Pulse 83    Temp 99.9 F (37.7 C)  (Temporal)    Resp 18    SpO2 96%   Physical Exam Vitals signs reviewed.  Constitutional:      General: She is not in acute distress.    Appearance: She is well-developed. She is not diaphoretic.  HENT:     Head: Normocephalic and atraumatic.     Right Ear: External ear normal.     Left Ear: External ear normal.     Nose: Nose normal.  Eyes:     General: No scleral icterus.    Conjunctiva/sclera: Conjunctivae normal.  Neck:     Musculoskeletal: Normal range of motion.     Trachea: Phonation normal.  Cardiovascular:     Rate and Rhythm: Normal rate and regular rhythm.  Pulmonary:     Effort: Pulmonary effort is normal. No respiratory distress.     Breath sounds: No stridor.  Abdominal:     General: There is no distension.     Tenderness: There is abdominal tenderness in the suprapubic area and left lower quadrant.     Hernia: No hernia is present.  Musculoskeletal: Normal range of motion.  Neurological:     Mental Status: She is alert and oriented to person, place, and time.  Psychiatric:        Behavior: Behavior normal.     ED Results and Treatments Labs (all labs ordered are listed, but only abnormal results are displayed) Labs Reviewed  COMPREHENSIVE METABOLIC PANEL - Abnormal; Notable for the following components:      Result Value   Glucose, Bld 114 (*)    All other components within normal limits  URINALYSIS, ROUTINE W REFLEX MICROSCOPIC - Abnormal; Notable for the following components:   Color, Urine STRAW (*)    Specific Gravity, Urine 1.003 (*)    Hgb urine dipstick SMALL (*)    Bacteria, UA RARE (*)    All other components within normal limits  LIPASE, BLOOD  CBC                                                                                                                         EKG  EKG Interpretation  Date/Time:    Ventricular Rate:    PR Interval:    QRS Duration:  QT Interval:    QTC Calculation:   R Axis:     Text Interpretation:          Radiology Ct Abdomen Pelvis Wo Contrast  Result Date: 08/12/2019 CLINICAL DATA:  Abdominal pain, diverticulitis suspected EXAM: CT ABDOMEN AND PELVIS WITHOUT CONTRAST TECHNIQUE: Multidetector CT imaging of the abdomen and pelvis was performed following the standard protocol without IV contrast. COMPARISON:  None. FINDINGS: Lower chest: Lung bases are clear. Normal heart size. No pericardial effusion. Hepatobiliary: No focal liver abnormality is seen. No gallstones, gallbladder wall thickening, or biliary dilatation. Pancreas: Unremarkable. No pancreatic ductal dilatation or surrounding inflammatory changes. Spleen: Normal in size without focal abnormality. Adrenals/Urinary Tract: Adrenal glands are unremarkable. Kidneys are normal, without renal calculi, focal lesion, or hydronephrosis. Mild asymmetric thickening of the urinary bladder, possibly reactive to the adjacent inflammatory process of the sigmoid. Stomach/Bowel: Moderate hiatal hernia. Duodenal sweep is unremarkable. No bowel wall thickening or dilatation. No evidence of obstruction. A normal appendix is visualized. Scattered colonic diverticula with a focal area segmental colonic thickening in the proximal sigmoid centered upon several of these diverticular outpouching (coronal series 6, image 45, axial series 3, image 66). No extraluminal gas is evident. Extensive adjacent phlegmonous changes present without organized collection or abscess formation. Vascular/Lymphatic: The aorta is normal caliber. Reactive adenopathy in the low abdomen. No suspicious or enlarged lymph nodes in the included lymphatic chains. Reproductive: Normal anteverted uterus. Left ovary is in close proximity to the inflamed sigmoid. No concerning adnexal lesions however Other: Reactive fluid and phlegmonous change in the left lower quadrant. No extraluminal gas. No organized collection or abscess formation. No bowel containing hernias. Small fat containing right inguinal  hernia. Musculoskeletal: Multilevel degenerative changes are present in the imaged portions of the spine. IMPRESSION: 1. Acute diverticulitis of the proximal sigmoid colon. No organized collection or abscess formation. 2. Mild asymmetric thickening of the urinary bladder wall, possibly reactive to the adjacent inflammatory process of the sigmoid. Correlate with urinalysis to exclude cystitis. 3. Moderate hiatal hernia. Electronically Signed   By: Lovena Le M.D.   On: 08/12/2019 03:01    Pertinent labs & imaging results that were available during my care of the patient were reviewed by me and considered in my medical decision making (see chart for details).  Medications Ordered in ED Medications  sodium chloride flush (NS) 0.9 % injection 3 mL (3 mLs Intravenous Not Given 08/12/19 0418)  acetaminophen (TYLENOL) tablet 1,000 mg (1,000 mg Oral Given 08/12/19 0441)  amoxicillin-clavulanate (AUGMENTIN) 875-125 MG per tablet 1 tablet (1 tablet Oral Given 08/12/19 0441)                                                                                                                                    Procedures Procedures  (including critical care time)  Medical Decision Making / ED Course I have reviewed the nursing notes for this encounter and  the patient's prior records (if available in EHR or on provided paperwork).   Maleeha Hanners was evaluated in Emergency Department on 08/12/2019 for the symptoms described in the history of present illness. She was evaluated in the context of the global COVID-19 pandemic, which necessitated consideration that the patient might be at risk for infection with the SARS-CoV-2 virus that causes COVID-19. Institutional protocols and algorithms that pertain to the evaluation of patients at risk for COVID-19 are in a state of rapid change based on information released by regulatory bodies including the CDC and federal and state organizations. These policies and algorithms  were followed during the patient's care in the ED.  Work-up notable for uncomplicated diverticulitis.  Labs reassuring.  Patient able to tolerate oral intake.  Given first dose of antibiotics in the emergency department.  The patient is safe for discharge with strict return precautions.  The patient appears reasonably screened and/or stabilized for discharge and I doubt any other medical condition or other [facility] requiring further screening, evaluation, or treatment in the ED at this time prior to discharge.       Final Clinical Impression(s) / ED Diagnoses Final diagnoses:  Diverticulitis    The patient appears reasonably screened and/or stabilized for discharge and I doubt any other medical condition or other [facility] requiring further screening, evaluation, or treatment in the ED at this time prior to discharge.  Disposition: Discharge  Condition: Good  I have discussed the results, Dx and Tx plan with the patient who expressed understanding and agree(s) with the plan. Discharge instructions discussed at great length. The patient was given strict return precautions who verbalized understanding of the instructions. No further questions at time of discharge.    ED Discharge Orders         Ordered    amoxicillin-clavulanate (AUGMENTIN) 875-125 MG tablet  Every 12 hours     08/12/19 0429    acetaminophen (TYLENOL) 500 MG tablet  Every 8 hours     08/12/19 0429    HYDROcodone-acetaminophen (NORCO/VICODIN) 5-325 MG tablet  Every 8 hours PRN,   Status:  Discontinued     08/12/19 0429    HYDROcodone-acetaminophen (NORCO/VICODIN) 5-325 MG tablet  Every 8 hours PRN     08/12/19 0430    ondansetron (ZOFRAN ODT) 4 MG disintegrating tablet  Every 8 hours PRN     08/12/19 [place] narcotic database reviewed and no active prescriptions noted.   Follow Up: Primary care provider  Schedule an appointment as soon as possible for a visit  If you do not have a primary care  physician, contact [facility] at [phone] for referral  Gastroenterology, [address] [place] [id number] [phone]         This chart was dictated using voice recognition software.  Despite best efforts to proofread,  errors can occur which can change the documentation meaning.   Fatima Blank, MD 08/12/19 [phone]

## 2019-08-22 DIAGNOSIS — H2513 Age-related nuclear cataract, bilateral: Secondary | ICD-10-CM | POA: Diagnosis not present

## 2019-08-22 DIAGNOSIS — H16223 Keratoconjunctivitis sicca, not specified as Sjogren's, bilateral: Secondary | ICD-10-CM | POA: Diagnosis not present

## 2019-08-22 DIAGNOSIS — H40013 Open angle with borderline findings, low risk, bilateral: Secondary | ICD-10-CM | POA: Diagnosis not present

## 2019-09-04 ENCOUNTER — Other Ambulatory Visit: Payer: Self-pay

## 2019-09-04 DIAGNOSIS — Z20828 Contact with and (suspected) exposure to other viral communicable diseases: Secondary | ICD-10-CM | POA: Diagnosis not present

## 2019-09-04 DIAGNOSIS — Z20822 Contact with and (suspected) exposure to covid-19: Secondary | ICD-10-CM

## 2019-09-06 LAB — NOVEL CORONAVIRUS, NAA: SARS-CoV-2, NAA: NOT DETECTED

## 2019-09-09 DIAGNOSIS — F3175 Bipolar disorder, in partial remission, most recent episode depressed: Secondary | ICD-10-CM | POA: Diagnosis not present

## 2019-09-24 DIAGNOSIS — K5732 Diverticulitis of large intestine without perforation or abscess without bleeding: Secondary | ICD-10-CM | POA: Diagnosis not present

## 2019-09-24 DIAGNOSIS — K219 Gastro-esophageal reflux disease without esophagitis: Secondary | ICD-10-CM | POA: Diagnosis not present

## 2019-09-24 DIAGNOSIS — Z86010 Personal history of colonic polyps: Secondary | ICD-10-CM | POA: Diagnosis not present

## 2019-09-24 DIAGNOSIS — K449 Diaphragmatic hernia without obstruction or gangrene: Secondary | ICD-10-CM | POA: Diagnosis not present

## 2019-09-25 ENCOUNTER — Other Ambulatory Visit: Payer: Self-pay | Admitting: Gastroenterology

## 2019-11-14 DIAGNOSIS — K5792 Diverticulitis of intestine, part unspecified, without perforation or abscess without bleeding: Secondary | ICD-10-CM

## 2019-11-14 HISTORY — DX: Diverticulitis of intestine, part unspecified, without perforation or abscess without bleeding: K57.92

## 2019-11-27 DIAGNOSIS — Z1159 Encounter for screening for other viral diseases: Secondary | ICD-10-CM | POA: Diagnosis not present

## 2019-12-02 DIAGNOSIS — R933 Abnormal findings on diagnostic imaging of other parts of digestive tract: Secondary | ICD-10-CM | POA: Diagnosis not present

## 2019-12-02 DIAGNOSIS — K5730 Diverticulosis of large intestine without perforation or abscess without bleeding: Secondary | ICD-10-CM | POA: Diagnosis not present

## 2019-12-02 DIAGNOSIS — K6289 Other specified diseases of anus and rectum: Secondary | ICD-10-CM | POA: Diagnosis not present

## 2019-12-02 HISTORY — PX: COLONOSCOPY: SHX174

## 2019-12-02 LAB — HM COLONOSCOPY

## 2019-12-11 ENCOUNTER — Other Ambulatory Visit: Payer: Self-pay

## 2019-12-12 ENCOUNTER — Ambulatory Visit: Payer: BLUE CROSS/BLUE SHIELD | Admitting: Family Medicine

## 2019-12-12 ENCOUNTER — Encounter: Payer: Self-pay | Admitting: Family Medicine

## 2019-12-12 VITALS — BP 130/72 | HR 80 | Temp 97.6°F | Ht 66.0 in | Wt 161.1 lb

## 2019-12-12 DIAGNOSIS — K449 Diaphragmatic hernia without obstruction or gangrene: Secondary | ICD-10-CM

## 2019-12-12 DIAGNOSIS — F319 Bipolar disorder, unspecified: Secondary | ICD-10-CM | POA: Insufficient documentation

## 2019-12-12 DIAGNOSIS — Z13220 Encounter for screening for lipoid disorders: Secondary | ICD-10-CM

## 2019-12-12 DIAGNOSIS — Z79899 Other long term (current) drug therapy: Secondary | ICD-10-CM

## 2019-12-12 DIAGNOSIS — K5790 Diverticulosis of intestine, part unspecified, without perforation or abscess without bleeding: Secondary | ICD-10-CM | POA: Diagnosis not present

## 2019-12-12 DIAGNOSIS — Z23 Encounter for immunization: Secondary | ICD-10-CM

## 2019-12-12 DIAGNOSIS — F3170 Bipolar disorder, currently in remission, most recent episode unspecified: Secondary | ICD-10-CM | POA: Diagnosis not present

## 2019-12-12 DIAGNOSIS — E039 Hypothyroidism, unspecified: Secondary | ICD-10-CM

## 2019-12-12 MED ORDER — FLUVOXAMINE MALEATE 50 MG PO TABS: 50.0000 mg | ORAL_TABLET | Freq: Two times a day (BID) | ORAL | 1 refills | Status: AC
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
## 2019-12-14 ENCOUNTER — Encounter: Payer: Self-pay | Admitting: Family Medicine

## 2019-12-14 DIAGNOSIS — M21612 Bunion of left foot: Secondary | ICD-10-CM | POA: Insufficient documentation

## 2019-12-14 DIAGNOSIS — M21611 Bunion of right foot: Secondary | ICD-10-CM | POA: Insufficient documentation

## 2019-12-15 DIAGNOSIS — M545 Low back pain: Secondary | ICD-10-CM | POA: Diagnosis not present

## 2019-12-15 DIAGNOSIS — S5002XA Contusion of left elbow, initial encounter: Secondary | ICD-10-CM | POA: Diagnosis not present

## 2019-12-15 DIAGNOSIS — S300XXA Contusion of lower back and pelvis, initial encounter: Secondary | ICD-10-CM | POA: Diagnosis not present

## 2019-12-15 DIAGNOSIS — M25522 Pain in left elbow: Secondary | ICD-10-CM | POA: Diagnosis not present

## 2019-12-15 DIAGNOSIS — S8002XA Contusion of left knee, initial encounter: Secondary | ICD-10-CM | POA: Diagnosis not present

## 2019-12-18 ENCOUNTER — Encounter: Payer: Self-pay | Admitting: Family Medicine

## 2020-01-13 DIAGNOSIS — Z1231 Encounter for screening mammogram for malignant neoplasm of breast: Secondary | ICD-10-CM | POA: Diagnosis not present

## 2020-01-16 DIAGNOSIS — Z6825 Body mass index (BMI) 25.0-25.9, adult: Secondary | ICD-10-CM | POA: Diagnosis not present

## 2020-01-16 DIAGNOSIS — N898 Other specified noninflammatory disorders of vagina: Secondary | ICD-10-CM | POA: Diagnosis not present

## 2020-01-16 DIAGNOSIS — Z124 Encounter for screening for malignant neoplasm of cervix: Secondary | ICD-10-CM | POA: Diagnosis not present

## 2020-01-16 DIAGNOSIS — Z01419 Encounter for gynecological examination (general) (routine) without abnormal findings: Secondary | ICD-10-CM | POA: Diagnosis not present

## 2020-01-16 DIAGNOSIS — R87810 Cervical high risk human papillomavirus (HPV) DNA test positive: Secondary | ICD-10-CM | POA: Diagnosis not present

## 2020-01-16 DIAGNOSIS — Z1151 Encounter for screening for human papillomavirus (HPV): Secondary | ICD-10-CM | POA: Diagnosis not present

## 2020-01-16 DIAGNOSIS — A6000 Herpesviral infection of urogenital system, unspecified: Secondary | ICD-10-CM | POA: Diagnosis not present

## 2020-01-16 DIAGNOSIS — R32 Unspecified urinary incontinence: Secondary | ICD-10-CM | POA: Diagnosis not present

## 2020-01-22 DIAGNOSIS — M7022 Olecranon bursitis, left elbow: Secondary | ICD-10-CM | POA: Diagnosis not present

## 2020-02-03 DIAGNOSIS — R87810 Cervical high risk human papillomavirus (HPV) DNA test positive: Secondary | ICD-10-CM | POA: Diagnosis not present

## 2020-02-03 DIAGNOSIS — B977 Papillomavirus as the cause of diseases classified elsewhere: Secondary | ICD-10-CM | POA: Diagnosis not present

## 2020-02-03 DIAGNOSIS — Z3202 Encounter for pregnancy test, result negative: Secondary | ICD-10-CM | POA: Diagnosis not present

## 2020-02-12 ENCOUNTER — Ambulatory Visit: Payer: BLUE CROSS/BLUE SHIELD | Attending: Internal Medicine

## 2020-02-12 DIAGNOSIS — Z23 Encounter for immunization: Secondary | ICD-10-CM

## 2020-02-12 NOTE — Progress Notes (Signed)
   Covid-19 Vaccination Clinic  Name:  Blinda Baumhardt    MRN: [id number] DOB: Mar 23, 1956  02/12/2020  Ms. Newville was observed post Covid-19 immunization for 15 minutes without incident. She was provided with Vaccine Information Sheet and instruction to access the V-Safe system.   Ms. Schiefer was instructed to call 911 with any severe reactions post vaccine: Marland Kitchen Difficulty breathing  . Swelling of face and throat  . A fast heartbeat  . A bad rash all over body  . Dizziness and weakness   Immunizations Administered    Name Date Dose VIS Date Route   Pfizer COVID-19 Vaccine 02/12/2020  9:24 AM 0.3 mL 10/24/2019 Intramuscular   Manufacturer: [place]   Lot: [id number]   [place]: [id number]

## 2020-02-26 ENCOUNTER — Other Ambulatory Visit: Payer: Self-pay

## 2020-02-27 ENCOUNTER — Other Ambulatory Visit: Payer: BLUE CROSS/BLUE SHIELD

## 2020-02-27 ENCOUNTER — Other Ambulatory Visit (INDEPENDENT_AMBULATORY_CARE_PROVIDER_SITE_OTHER): Payer: BLUE CROSS/BLUE SHIELD

## 2020-02-27 DIAGNOSIS — Z13220 Encounter for screening for lipoid disorders: Secondary | ICD-10-CM

## 2020-02-27 DIAGNOSIS — K5790 Diverticulosis of intestine, part unspecified, without perforation or abscess without bleeding: Secondary | ICD-10-CM | POA: Diagnosis not present

## 2020-02-27 DIAGNOSIS — E039 Hypothyroidism, unspecified: Secondary | ICD-10-CM

## 2020-02-27 LAB — COMPREHENSIVE METABOLIC PANEL
ALT: 17 U/L (ref 0–35)
AST: 18 U/L (ref 0–37)
Albumin: 4.3 g/dL (ref 3.5–5.2)
Alkaline Phosphatase: 80 U/L (ref 39–117)
BUN: 16 mg/dL (ref 6–23)
CO2: 27 mEq/L (ref 19–32)
Calcium: 9.4 mg/dL (ref 8.4–10.5)
Chloride: 105 mEq/L (ref 96–112)
Creatinine, Ser: 0.78 mg/dL (ref 0.40–1.20)
GFR: 74.78 mL/min (ref >60.00)
Glucose, Bld: 110 mg/dL — ABNORMAL HIGH (ref 70–99)
Potassium: 4.7 mEq/L (ref 3.5–5.1)
Sodium: 138 mEq/L (ref 135–145)
Total Bilirubin: 0.5 mg/dL (ref 0.2–1.2)
Total Protein: 6.7 g/dL (ref 6.0–8.3)

## 2020-02-27 LAB — LIPID PANEL
Cholesterol: 222 mg/dL — ABNORMAL HIGH (ref 0–200)
HDL: 68.1 mg/dL (ref >39.00)
LDL Cholesterol: 135 mg/dL — ABNORMAL HIGH (ref 0–99)
NonHDL: 154.32
Total CHOL/HDL Ratio: 3
Triglycerides: 96 mg/dL (ref 0.0–149.0)
VLDL: 19.2 mg/dL (ref 0.0–40.0)

## 2020-02-27 LAB — CBC WITH DIFFERENTIAL/PLATELET
Basophils Absolute: 0 10*3/uL (ref 0.0–0.1)
Basophils Relative: 0.9 % (ref 0.0–3.0)
Eosinophils Absolute: 0.1 10*3/uL (ref 0.0–0.7)
Eosinophils Relative: 1.7 % (ref 0.0–5.0)
HCT: 35.5 % — ABNORMAL LOW (ref 36.0–46.0)
Hemoglobin: 12.1 g/dL (ref 12.0–15.0)
Lymphocytes Relative: 25.9 % (ref 12.0–46.0)
Lymphs Abs: 1.3 10*3/uL (ref 0.7–4.0)
MCHC: 34.1 g/dL (ref 30.0–36.0)
MCV: 93.2 fl (ref 78.0–100.0)
Monocytes Absolute: 0.4 10*3/uL (ref 0.1–1.0)
Monocytes Relative: 8.8 % (ref 3.0–12.0)
Neutro Abs: 3.2 10*3/uL (ref 1.4–7.7)
Neutrophils Relative %: 62.7 % (ref 43.0–77.0)
Platelets: 261 10*3/uL (ref 150.0–400.0)
RBC: 3.81 Mil/uL — ABNORMAL LOW (ref 3.87–5.11)
RDW: 14.7 % (ref 11.5–15.5)
WBC: 5.1 10*3/uL (ref 4.0–10.5)

## 2020-02-27 LAB — TSH: TSH: 3.22 u[IU]/mL (ref 0.35–4.50)

## 2020-02-27 LAB — T3, FREE: T3, Free: 3.5 pg/mL (ref 2.3–4.2)

## 2020-02-27 LAB — T4, FREE: Free T4: 0.8 ng/dL (ref 0.60–1.60)

## 2020-03-05 ENCOUNTER — Other Ambulatory Visit: Payer: BLUE CROSS/BLUE SHIELD

## 2020-03-08 ENCOUNTER — Ambulatory Visit: Payer: BLUE CROSS/BLUE SHIELD | Attending: Internal Medicine

## 2020-03-08 DIAGNOSIS — Z23 Encounter for immunization: Secondary | ICD-10-CM

## 2020-03-11 ENCOUNTER — Other Ambulatory Visit: Payer: Self-pay

## 2020-03-12 ENCOUNTER — Encounter: Payer: Self-pay | Admitting: Family Medicine

## 2020-03-12 ENCOUNTER — Ambulatory Visit (INDEPENDENT_AMBULATORY_CARE_PROVIDER_SITE_OTHER): Payer: BLUE CROSS/BLUE SHIELD | Admitting: Family Medicine

## 2020-03-12 VITALS — BP 98/70 | HR 74 | Temp 98.1°F | Wt 159.3 lb

## 2020-03-12 DIAGNOSIS — Z79899 Other long term (current) drug therapy: Secondary | ICD-10-CM | POA: Diagnosis not present

## 2020-03-12 DIAGNOSIS — Z23 Encounter for immunization: Secondary | ICD-10-CM | POA: Diagnosis not present

## 2020-03-12 DIAGNOSIS — R739 Hyperglycemia, unspecified: Secondary | ICD-10-CM

## 2020-03-12 DIAGNOSIS — F3170 Bipolar disorder, currently in remission, most recent episode unspecified: Secondary | ICD-10-CM | POA: Diagnosis not present

## 2020-03-12 DIAGNOSIS — Z0000 Encounter for general adult medical examination without abnormal findings: Secondary | ICD-10-CM | POA: Diagnosis not present

## 2020-03-12 LAB — POCT GLYCOSYLATED HEMOGLOBIN (HGB A1C): Hemoglobin A1C: 5.3 % (ref 4.0–5.6)

## 2020-03-12 NOTE — Progress Notes (Signed)
Summer Craig DOB: 1956-11-13 Encounter date: 03/12/2020  This is a 63 y.o. female who presents for complete physical   History of present illness/Additional concerns:  No specific concerns today. Just wanted to review bloodwork.  Energy level is doing fine. Does try to walk on regular basis.   Colonoscopy due 10/2029  Has seen dermatology in past; tries to follow up with them regularly.   Gynecologist: Pamala Hurry; had mammogram in Jan/FEb; had to get colposcopy after pap but this was ok  Gastro: Saw Dr. Ronnette Juniper - for colonoscopy; not due for 10 years. A lot of diverticulosis.   Thinks Tetanus was in the last 10 years; she is going to check records.   Has had HIV screening in the past (in her 73's).   Past Medical History:  Diagnosis Date  . Arthritis   . Bipolar disorder ([place])   . Depression   . Diverticulitis   . Family history of polyps in the colon   . Genital warts   . Urine incontinence    Past Surgical History:  Procedure Laterality Date  . bunion removal    . TONSILLECTOMY AND ADENOIDECTOMY     Allergies  Allergen Reactions  . No Known Allergies    Current Meds  Medication Sig  . Biotin (BIOTIN 5000) 5 MG CAPS Take by mouth.  . clorazepate (TRANXENE-T) 7.5 MG tablet Take by mouth.  . fluvoxaMINE (LUVOX) 50 MG tablet Take 1 tablet (50 mg total) by mouth 2 (two) times daily.  . folic acid (FOLVITE) 1 MG tablet TK 1 T PO D  . lamoTRIgine (LAMICTAL) 200 MG tablet TK 1 T PO ONCE A DAY  . metFORMIN (GLUCOPHAGE-XR) 500 MG 24 hr tablet TAKE ONE TABLET BID  . MYRBETRIQ 50 MG TB24 tablet Take 50 mg by mouth daily.  Marland Kitchen OLANZapine (ZYPREXA) 2.5 MG tablet TK 1 T PO Q NIGHT   Social History   Tobacco Use  . Smoking status: Never Smoker  . Smokeless tobacco: Never Used  Substance Use Topics  . Alcohol use: Yes   Family History  Problem Relation Age of Onset  . Sleep apnea Mother   . Arthritis Mother   . Alzheimer's disease Mother   . Cancer Father    brain  . Alzheimer's disease Maternal Grandmother      Review of Systems  Constitutional: Negative for activity change, appetite change, chills, fatigue, fever and unexpected weight change.  HENT: Negative for congestion, ear pain, hearing loss, sinus pressure, sinus pain, sore throat and trouble swallowing.   Eyes: Negative for pain and visual disturbance.  Respiratory: Negative for cough, chest tightness, shortness of breath and wheezing.   Cardiovascular: Negative for chest pain, palpitations and leg swelling.  Gastrointestinal: Negative for abdominal pain, blood in stool, constipation, diarrhea, nausea and vomiting.  Genitourinary: Negative for difficulty urinating and menstrual problem.  Musculoskeletal: Negative for arthralgias and back pain.  Skin: Negative for rash.  Neurological: Negative for dizziness, weakness, numbness and headaches.  Hematological: Negative for adenopathy. Does not bruise/bleed easily.  Psychiatric/Behavioral: Negative for sleep disturbance and suicidal ideas. The patient is not nervous/anxious.     CBC:  Lab Results  Component Value Date   WBC 5.1 02/27/2020   HGB 12.1 02/27/2020   HCT 35.5 (L) 02/27/2020   MCH 31.5 08/11/2019   MCHC 34.1 02/27/2020   RDW 14.7 02/27/2020   PLT 261.0 02/27/2020   CMP: Lab Results  Component Value Date   NA 138 02/27/2020   K  4.7 02/27/2020   CL 105 02/27/2020   CO2 27 02/27/2020   ANIONGAP 10 08/11/2019   GLUCOSE 110 (H) 02/27/2020   BUN 16 02/27/2020   CREATININE 0.78 02/27/2020   GFRAA >60 08/11/2019   CALCIUM 9.4 02/27/2020   PROT 6.7 02/27/2020   BILITOT 0.5 02/27/2020   ALKPHOS 80 02/27/2020   ALT 17 02/27/2020   AST 18 02/27/2020   LIPID: Lab Results  Component Value Date   CHOL 222 (H) 02/27/2020   TRIG 96.0 02/27/2020   HDL 68.10 02/27/2020   LDLCALC 135 (H) 02/27/2020    Objective:  BP 98/70 (BP Location: Left Arm, Patient Position: Sitting, Cuff Size: Normal)   Pulse 74   Temp 98.1  F (36.7 C) (Temporal)   Wt 159 lb 4.8 oz (72.3 kg)   BMI 25.71 kg/m   Weight: 159 lb 4.8 oz (72.3 kg)   BP Readings from Last 3 Encounters:  03/12/20 98/70  12/12/19 130/72  08/12/19 129/70   Wt Readings from Last 3 Encounters:  03/12/20 159 lb 4.8 oz (72.3 kg)  12/12/19 161 lb 1.6 oz (73.1 kg)  07/25/18 167 lb 14.4 oz (76.2 kg)    Physical Exam Constitutional:      General: She is not in acute distress.    Appearance: She is well-developed.  HENT:     Head: Normocephalic and atraumatic.     Right Ear: External ear normal.     Left Ear: External ear normal.     Mouth/Throat:     Pharynx: No oropharyngeal exudate.  Eyes:     Conjunctiva/sclera: Conjunctivae normal.     Pupils: Pupils are equal, round, and reactive to light.  Neck:     Thyroid: No thyromegaly.  Cardiovascular:     Rate and Rhythm: Normal rate and regular rhythm.     Heart sounds: Normal heart sounds. No murmur. No friction rub. No gallop.   Pulmonary:     Effort: Pulmonary effort is normal.     Breath sounds: Normal breath sounds.  Abdominal:     General: Bowel sounds are normal. There is no distension.     Palpations: Abdomen is soft. There is no mass.     Tenderness: There is no abdominal tenderness. There is no guarding.     Hernia: No hernia is present.  Musculoskeletal:        General: No tenderness or deformity. Normal range of motion.     Cervical back: Normal range of motion and neck supple.  Lymphadenopathy:     Cervical: No cervical adenopathy.  Skin:    General: Skin is warm and dry.     Findings: No rash.  Neurological:     Mental Status: She is alert and oriented to person, place, and time.     Deep Tendon Reflexes: Reflexes normal.     Reflex Scores:      Tricep reflexes are 2+ on the right side and 2+ on the left side.      Bicep reflexes are 2+ on the right side and 2+ on the left side.      Brachioradialis reflexes are 2+ on the right side and 2+ on the left side.       Patellar reflexes are 2+ on the right side and 2+ on the left side. Psychiatric:        Speech: Speech normal.        Behavior: Behavior normal.        Thought Content: Thought content  normal.     Assessment/Plan: Health Maintenance Due  Topic Date Due  . Hepatitis C Screening  Never done  . HIV Screening  Never done  . TETANUS/TDAP  Never done  . PAP SMEAR-Modifier  Never done  . MAMMOGRAM  Never done   Health Maintenance reviewed - second shingles given today.  1. Preventative health care She stays active through her work and we discussed importance of regular exercise and keeping up with well rounded diet.  - Hepatitis C antibody; Future  2. Hyperglycemia She was concerned about elevated blood sugar, but A1c was 5.3 today which is reassuring. - POC HgB A1c - Hemoglobin A1c; Future  3. Bipolar disorder in full remission, most recent episode unspecified type ([place]) Mood has been stable and well controlled.  She follows regularly with psychiatry.  4. High risk medication use Reviewed blood work with her which is stable.  Psychiatry likes her to repeat every 6 months. - CBC with Differential/Platelet; Future - Comprehensive metabolic panel; Future  5. Need for shingles vaccine - Varicella-zoster vaccine IM (Shingrix)  Return in about 6 months (around 09/11/2020) for Chronic condition visit.  Micheline Rough, MD

## 2020-04-29 DIAGNOSIS — F3175 Bipolar disorder, in partial remission, most recent episode depressed: Secondary | ICD-10-CM | POA: Diagnosis not present

## 2020-06-15 DIAGNOSIS — M25562 Pain in left knee: Secondary | ICD-10-CM | POA: Diagnosis not present

## 2020-07-13 DIAGNOSIS — M25562 Pain in left knee: Secondary | ICD-10-CM | POA: Diagnosis not present

## 2020-07-13 DIAGNOSIS — S83242A Other tear of medial meniscus, current injury, left knee, initial encounter: Secondary | ICD-10-CM | POA: Diagnosis not present

## 2020-07-22 DIAGNOSIS — M25562 Pain in left knee: Secondary | ICD-10-CM | POA: Diagnosis not present

## 2020-07-30 DIAGNOSIS — S93402A Sprain of unspecified ligament of left ankle, initial encounter: Secondary | ICD-10-CM | POA: Diagnosis not present

## 2020-07-30 DIAGNOSIS — M25572 Pain in left ankle and joints of left foot: Secondary | ICD-10-CM | POA: Diagnosis not present

## 2020-08-04 ENCOUNTER — Other Ambulatory Visit (HOSPITAL_COMMUNITY): Payer: Self-pay | Admitting: Orthopaedic Surgery

## 2020-08-04 DIAGNOSIS — M7989 Other specified soft tissue disorders: Secondary | ICD-10-CM

## 2020-08-04 DIAGNOSIS — M25572 Pain in left ankle and joints of left foot: Secondary | ICD-10-CM | POA: Diagnosis not present

## 2020-08-05 ENCOUNTER — Other Ambulatory Visit: Payer: Self-pay

## 2020-08-05 ENCOUNTER — Ambulatory Visit (HOSPITAL_COMMUNITY)
Admission: RE | Admit: 2020-08-05 | Discharge: 2020-08-05 | Disposition: A | Discharge status: Home / Self Care | Payer: BLUE CROSS/BLUE SHIELD | Source: Ambulatory Visit | Attending: Orthopaedic Surgery | Admitting: Orthopaedic Surgery

## 2020-08-05 DIAGNOSIS — M7989 Other specified soft tissue disorders: Secondary | ICD-10-CM | POA: Diagnosis not present

## 2020-08-05 DIAGNOSIS — M79605 Pain in left leg: Secondary | ICD-10-CM | POA: Diagnosis not present

## 2020-08-05 NOTE — Progress Notes (Signed)
Left lower extremity venous duplex has been completed. Preliminary results can be found in CV Proc through chart review.  Results were given to [facility] at Dr. Jerald Kief office.  08/05/20 8:56 AM Summer Craig RVT

## 2020-08-07 DIAGNOSIS — M25572 Pain in left ankle and joints of left foot: Secondary | ICD-10-CM | POA: Diagnosis not present

## 2020-08-07 DIAGNOSIS — S93402A Sprain of unspecified ligament of left ankle, initial encounter: Secondary | ICD-10-CM | POA: Diagnosis not present

## 2020-08-07 DIAGNOSIS — M25562 Pain in left knee: Secondary | ICD-10-CM | POA: Diagnosis not present

## 2020-08-24 DIAGNOSIS — M25562 Pain in left knee: Secondary | ICD-10-CM | POA: Diagnosis not present

## 2020-08-24 DIAGNOSIS — M25572 Pain in left ankle and joints of left foot: Secondary | ICD-10-CM | POA: Diagnosis not present

## 2020-08-25 DIAGNOSIS — F3175 Bipolar disorder, in partial remission, most recent episode depressed: Secondary | ICD-10-CM | POA: Diagnosis not present

## 2020-09-03 ENCOUNTER — Other Ambulatory Visit: Payer: BLUE CROSS/BLUE SHIELD

## 2020-09-15 DIAGNOSIS — F3175 Bipolar disorder, in partial remission, most recent episode depressed: Secondary | ICD-10-CM | POA: Diagnosis not present

## 2020-09-17 ENCOUNTER — Ambulatory Visit: Payer: BLUE CROSS/BLUE SHIELD | Admitting: Family Medicine

## 2020-09-21 DIAGNOSIS — M25572 Pain in left ankle and joints of left foot: Secondary | ICD-10-CM | POA: Diagnosis not present

## 2020-09-25 DIAGNOSIS — M25572 Pain in left ankle and joints of left foot: Secondary | ICD-10-CM | POA: Diagnosis not present

## 2020-09-28 DIAGNOSIS — F3175 Bipolar disorder, in partial remission, most recent episode depressed: Secondary | ICD-10-CM | POA: Diagnosis not present

## 2020-10-20 DIAGNOSIS — S86112A Strain of other muscle(s) and tendon(s) of posterior muscle group at lower leg level, left leg, initial encounter: Secondary | ICD-10-CM | POA: Diagnosis not present

## 2020-10-25 ENCOUNTER — Other Ambulatory Visit (INDEPENDENT_AMBULATORY_CARE_PROVIDER_SITE_OTHER): Payer: BLUE CROSS/BLUE SHIELD

## 2020-10-25 ENCOUNTER — Other Ambulatory Visit: Payer: Self-pay | Admitting: Orthopaedic Surgery

## 2020-10-25 ENCOUNTER — Other Ambulatory Visit: Payer: Self-pay

## 2020-10-25 DIAGNOSIS — Z79899 Other long term (current) drug therapy: Secondary | ICD-10-CM

## 2020-10-25 DIAGNOSIS — R739 Hyperglycemia, unspecified: Secondary | ICD-10-CM

## 2020-10-25 DIAGNOSIS — Z0000 Encounter for general adult medical examination without abnormal findings: Secondary | ICD-10-CM

## 2020-10-25 NOTE — Addendum Note (Signed)
Addended by: Marrion Coy on: 10/25/2020 09:55 AM   Modules accepted: Orders

## 2020-10-26 LAB — HEPATITIS C ANTIBODY
Hepatitis C Ab: NONREACTIVE
SIGNAL TO CUT-OFF: 0.04 (ref <1.00)

## 2020-10-26 LAB — CBC WITH DIFFERENTIAL/PLATELET
Absolute Monocytes: 405 cells/uL (ref 200–950)
Basophils Absolute: 32 cells/uL (ref 0–200)
Basophils Relative: 0.7 %
Eosinophils Absolute: 81 cells/uL (ref 15–500)
Eosinophils Relative: 1.8 %
HCT: 35.2 % (ref 35.0–45.0)
Hemoglobin: 11.7 g/dL (ref 11.7–15.5)
Lymphs Abs: 1584 cells/uL (ref 850–3900)
MCH: 30.2 pg (ref 27.0–33.0)
MCHC: 33.2 g/dL (ref 32.0–36.0)
MCV: 91 fL (ref 80.0–100.0)
MPV: 10.1 fL (ref 7.5–12.5)
Monocytes Relative: 9 %
Neutro Abs: 2399 cells/uL (ref 1500–7800)
Neutrophils Relative %: 53.3 %
Platelets: 267 10*3/uL (ref 140–400)
RBC: 3.87 10*6/uL (ref 3.80–5.10)
RDW: 11.8 % (ref 11.0–15.0)
Total Lymphocyte: 35.2 %
WBC: 4.5 10*3/uL (ref 3.8–10.8)

## 2020-10-26 LAB — HEMOGLOBIN A1C
Hgb A1c MFr Bld: 5.7 % of total Hgb — ABNORMAL HIGH (ref <5.7)
Mean Plasma Glucose: 117 mg/dL
eAG (mmol/L): 6.5 mmol/L

## 2020-10-26 LAB — COMPREHENSIVE METABOLIC PANEL
AG Ratio: 1.6 (calc) (ref 1.0–2.5)
ALT: 17 U/L (ref 6–29)
AST: 16 U/L (ref 10–35)
Albumin: 4.1 g/dL (ref 3.6–5.1)
Alkaline phosphatase (APISO): 76 U/L (ref 37–153)
BUN: 17 mg/dL (ref 7–25)
CO2: 27 mmol/L (ref 20–32)
Calcium: 9.5 mg/dL (ref 8.6–10.4)
Chloride: 102 mmol/L (ref 98–110)
Creat: 0.78 mg/dL (ref 0.50–0.99)
Globulin: 2.5 g/dL (calc) (ref 1.9–3.7)
Glucose, Bld: 91 mg/dL (ref 65–99)
Potassium: 4.3 mmol/L (ref 3.5–5.3)
Sodium: 139 mmol/L (ref 135–146)
Total Bilirubin: 0.6 mg/dL (ref 0.2–1.2)
Total Protein: 6.6 g/dL (ref 6.1–8.1)

## 2020-11-02 ENCOUNTER — Encounter (HOSPITAL_COMMUNITY): Payer: Self-pay

## 2020-11-02 ENCOUNTER — Other Ambulatory Visit: Payer: Self-pay

## 2020-11-02 ENCOUNTER — Encounter (HOSPITAL_COMMUNITY)
Admission: RE | Admit: 2020-11-02 | Discharge: 2020-11-02 | Disposition: A | Discharge status: Home / Self Care | Payer: BLUE CROSS/BLUE SHIELD | Source: Ambulatory Visit | Attending: Orthopaedic Surgery | Admitting: Orthopaedic Surgery

## 2020-11-02 DIAGNOSIS — Z01812 Encounter for preprocedural laboratory examination: Secondary | ICD-10-CM | POA: Diagnosis not present

## 2020-11-02 HISTORY — DX: Gastro-esophageal reflux disease without esophagitis: K21.9

## 2020-11-02 HISTORY — DX: Acute embolism and thrombosis of unspecified deep veins of unspecified lower extremity: I82.409

## 2020-11-02 LAB — CBC
HCT: 37.1 % (ref 36.0–46.0)
Hemoglobin: 12.6 g/dL (ref 12.0–15.0)
MCH: 31.8 pg (ref 26.0–34.0)
MCHC: 34 g/dL (ref 30.0–36.0)
MCV: 93.7 fL (ref 80.0–100.0)
Platelets: 302 10*3/uL (ref 150–400)
RBC: 3.96 MIL/uL (ref 3.87–5.11)
RDW: 12.4 % (ref 11.5–15.5)
WBC: 5.3 10*3/uL (ref 4.0–10.5)
nRBC: 0 % (ref 0.0–0.2)

## 2020-11-02 LAB — TYPE AND SCREEN
ABO/RH(D): A POS
Antibody Screen: NEGATIVE

## 2020-11-02 NOTE — Progress Notes (Signed)
PCP - Dr. Kyra Searles  Cardiologist - no Chest x-ray -   EKG - na  Stress Test - no  ECHO - no  Cardiac Cath - no  Sleep Study - no CPAP - no  LABS-CBC, T/S  ASA-no  ERAS-yes  HA1C-na Fasting Blood Sugar - na Checks Blood Sugar ____0_ times a day   Summer Craig takes Metformin for weight management.  Anesthesia-  Pt denies having chest pain, sob, or fever at this time. All instructions explained to the pt, with a verbal understanding of the material. Pt agrees to go over the instructions while at home for a better understanding. Pt also instructed to self quarantine after being tested for COVID-19. The opportunity to ask questions was provided.  Summer Craig arrived to PAT appointment, patient said right away , that she has lots of questions regarding insurance and after care. "The lady who asked about my meications, said that I would have the answers."  I told the patient that I do not handle insurance and I do not know about aftercare. I asked Summer. Craig what kind of questions she has about aftercare.  "I live alone and I want to know if I can stay longer, go to Inpatient rehab or get help at home, my Insurance will pay for it."  I informed patient that she would see a Education officer, museum will she is in the hospital, Dr. Lucia Gaskins makes recommendations on aftercare, based on how you progress , PT information and physical therapy assessment.  Summer. Craig said that she is going home and make some phone calls, starting with Dr. Pollie Friar office.

## 2020-11-02 NOTE — Pre-Procedure Instructions (Signed)
Summer Craig  11/02/2020     Your procedure is scheduled on Tuesday, December 28.  Report to [facility], Main Entrance or Entrance "A" at 10:00 AM               Your surgery or procedure is scheduled to begin at 12:00 noon   Call this number if you have problems the morning of surgery: [phone]  This is the number for the Pre- Surgical Desk.                For any other questions, please call [phone], Monday - Friday 8 AM - 4 PM.   Remember:  Do not eat after midnight.  You may drink clear liquids until 9:00 AM.   Clear liquids allowed are:                     Water, Juice (non-citric and without pulp - diabetics please choose diet or no sugar options), Carbonated beverages - (diabetics please choose diet or no sugar options), Clear Tea, Black Coffee only (no creamer, milk or cream including half and half), Plain Jell-O only (diabetics please choose diet or no sugar options), Gatorade (diabetics please choose diet or no sugar options) and Plain Popsicles only    Take these medicines the morning of surgery with A SIP OF WATER: LluvoxaMINE (LUVOX)  lamoTRIgine (LAMICTAL)  Do Not take metFORMIN the morning of surgery. 1 Week prior to surgery STOP taking Aspirin, Aspirin Products (Goody Powder, Excedrin Migraine), Ibuprofen (Advil), Naproxen (Aleve), Vitamins and Herbal Products (ie Fish Oil).    Special instructions:    [place] [place]- Preparing For Surgery  Before surgery, you can play an important role. Because skin is not sterile, your skin needs to be as free of germs as possible. You can reduce the number of germs on your skin by washing with CHG (chlorahexidine gluconate) Soap before surgery.  CHG is an antiseptic cleaner which kills germs and bonds with the skin to continue killing germs even after washing.    Oral Hygiene is also important to reduce your risk of infection.  Remember - BRUSH YOUR TEETH THE MORNING OF SURGERY WITH YOUR REGULAR  TOOTHPASTE  Please do not use if you have an allergy to CHG or antibacterial soaps. If your skin becomes reddened/irritated stop using the CHG.  Do not shave (including legs and underarms) for at least 48 hours prior to first CHG shower. It is OK to shave your face.  Please follow these instructions carefully.   1. Shower the NIGHT BEFORE SURGERY and the MORNING OF SURGERY with CHG.   2. If you chose to wash your hair, wash your hair first as usual with your normal shampoo.  3. After you shampoo, wash your face and private area with the soap you use at home, then rinse your hair and body thoroughly to remove the shampoo and soap.  4. Use CHG as you would any other liquid soap. You can apply CHG directly to the skin and wash gently with a scrungie or a clean washcloth.   5. Apply the CHG Soap to your body ONLY FROM THE NECK DOWN.  Do not use on open wounds or open sores. Avoid contact with your eyes, ears, mouth and genitals (private parts).   6. Wash thoroughly, paying special attention to the area where your surgery will be performed.  7. Thoroughly rinse your body with warm water from the neck down.  8. DO  NOT shower/wash with your normal soap after using and rinsing off the CHG Soap.  9. Pat yourself dry with a CLEAN TOWEL.  10. Wear CLEAN PAJAMAS to bed the night before surgery, wear comfortable clothes the morning of surgery  11. Place CLEAN SHEETS on your bed the night of your first shower and DO NOT SLEEP WITH PETS.  Day of Surgery: Shower as instructed above. Do not apply any deodorants/lotions, powders or colognes.  Please wear clean clothes to the hospital/surgery center.   Remember to brush your teeth WITH YOUR REGULAR TOOTHPASTE.  Do not wear jewelry, make-up or nail polish.  Do not shave 48 hours prior to surgery.  Men may shave face and neck.  Do not bring valuables to the hospital.  [facility] is not responsible for any belongings or valuables.  Contacts,  dentures or bridgework may not be worn into surgery.  Leave your suitcase in the car.  After surgery it may be brought to your room.  For patients admitted to the hospital, discharge time will be determined by your treatment team.  Patients discharged the day of surgery will not be allowed to drive home.   Please read over the fact sheets that you were given.

## 2020-11-02 NOTE — Progress Notes (Signed)
   How to Manage Your Diabetes Before and After Surgery  Why is it important to control my blood sugar before and after surgery? . Improving blood sugar levels before and after surgery helps healing and can limit problems. . A way of improving blood sugar control is eating a healthy diet by: o  Eating less sugar and carbohydrates o  Increasing activity/exercise o  Talking with your doctor about reaching your blood sugar goals . High blood sugars (greater than 180 mg/dL) can raise your risk of infections and slow your recovery, so you will need to focus on controlling your diabetes during the weeks before surgery. . Make sure that the doctor who takes care of your diabetes knows about your planned surgery including the date and location.  How do I manage my blood sugar before surgery? . Check your blood sugar at least 4 times a day, starting 2 days before surgery, to make sure that the level is not too high or low. o Check your blood sugar the morning of your surgery when you wake up and every 2 hours until you get to the Short Stay unit. . If your blood sugar is less than 70 mg/dL, you will need to treat for low blood sugar: o Do not take insulin. o Treat a low blood sugar (less than 70 mg/dL) with  cup of clear juice (cranberry or apple), 4 glucose tablets, OR glucose gel. Recheck blood sugar in 15 minutes after treatment (to make sure it is greater than 70 mg/dL). If your blood sugar is not greater than 70 mg/dL on recheck, call [phone] o  for further instructions. . Report your blood sugar to the short stay nurse when you get to Short Stay.  . If you are admitted to the hospital after surgery: o Your blood sugar will be checked by the staff and you will probably be given insulin after surgery (instead of oral diabetes medicines) to make sure you have good blood sugar levels. o The goal for blood sugar control after surgery is 80-180 mg/dL.  DO NOT take Metformin the morning of  surgery

## 2020-11-08 ENCOUNTER — Other Ambulatory Visit (HOSPITAL_COMMUNITY)
Admission: RE | Admit: 2020-11-08 | Discharge: 2020-11-08 | Disposition: A | Discharge status: Home / Self Care | Payer: BLUE CROSS/BLUE SHIELD | Source: Ambulatory Visit | Attending: Orthopaedic Surgery | Admitting: Orthopaedic Surgery

## 2020-11-08 DIAGNOSIS — Z01812 Encounter for preprocedural laboratory examination: Secondary | ICD-10-CM | POA: Diagnosis not present

## 2020-11-08 DIAGNOSIS — Z20822 Contact with and (suspected) exposure to covid-19: Secondary | ICD-10-CM | POA: Diagnosis not present

## 2020-11-08 LAB — SARS CORONAVIRUS 2 (TAT 6-24 HRS): SARS Coronavirus 2: NEGATIVE

## 2020-11-08 NOTE — Anesthesia Preprocedure Evaluation (Addendum)
Anesthesia Evaluation  Patient identified by MRN, date of birth, ID band Patient awake    Reviewed: Allergy & Precautions, NPO status , Patient's Chart, lab work & pertinent test results  Airway Mallampati: II  TM Distance: >3 FB Neck ROM: Full    Dental  (+) Dental Advisory Given, Teeth Intact   Pulmonary neg pulmonary ROS,    Pulmonary exam normal breath sounds clear to auscultation       Cardiovascular negative cardio ROS Normal cardiovascular exam Rhythm:Regular Rate:Normal     Neuro/Psych PSYCHIATRIC DISORDERS Depression Bipolar Disorder negative neurological ROS     GI/Hepatic Neg liver ROS, hiatal hernia, GERD  ,  Endo/Other  negative endocrine ROS  Renal/GU negative Renal ROS     Musculoskeletal  (+) Arthritis ,   Abdominal   Peds  Hematology negative hematology ROS (+)   Anesthesia Other Findings   Reproductive/Obstetrics                            Anesthesia Physical Anesthesia Plan  ASA: III  Anesthesia Plan: General   Post-op Pain Management:    Induction: Intravenous  PONV Risk Score and Plan: 4 or greater and Ondansetron, Dexamethasone, Treatment may vary due to age or medical condition and Midazolam  Airway Management Planned: LMA  Additional Equipment: None  Intra-op Plan:   Post-operative Plan: Extubation in OR  Informed Consent: I have reviewed the patients History and Physical, chart, labs and discussed the procedure including the risks, benefits and alternatives for the proposed anesthesia with the patient or authorized representative who has indicated his/her understanding and acceptance.     Dental advisory given  Plan Discussed with: CRNA  Anesthesia Plan Comments:        Anesthesia Quick Evaluation

## 2020-11-09 ENCOUNTER — Encounter (HOSPITAL_COMMUNITY)
Admission: RE | Disposition: A | Discharge status: Home / Self Care | Payer: Self-pay | Source: Home / Self Care | Attending: Orthopaedic Surgery

## 2020-11-09 ENCOUNTER — Ambulatory Visit (HOSPITAL_COMMUNITY): Payer: BLUE CROSS/BLUE SHIELD

## 2020-11-09 ENCOUNTER — Observation Stay (HOSPITAL_COMMUNITY)
Admission: RE | Admit: 2020-11-09 | Discharge: 2020-11-11 | Disposition: A | Discharge status: Home / Self Care | Payer: BLUE CROSS/BLUE SHIELD | Attending: Orthopaedic Surgery | Admitting: Orthopaedic Surgery

## 2020-11-09 ENCOUNTER — Encounter (HOSPITAL_COMMUNITY): Payer: Self-pay | Admitting: Orthopaedic Surgery

## 2020-11-09 ENCOUNTER — Ambulatory Visit (HOSPITAL_COMMUNITY): Payer: BLUE CROSS/BLUE SHIELD | Admitting: Anesthesiology

## 2020-11-09 ENCOUNTER — Other Ambulatory Visit: Payer: Self-pay

## 2020-11-09 DIAGNOSIS — M67972 Unspecified disorder of synovium and tendon, left ankle and foot: Secondary | ICD-10-CM | POA: Diagnosis not present

## 2020-11-09 DIAGNOSIS — K449 Diaphragmatic hernia without obstruction or gangrene: Secondary | ICD-10-CM | POA: Diagnosis not present

## 2020-11-09 DIAGNOSIS — S86112A Strain of other muscle(s) and tendon(s) of posterior muscle group at lower leg level, left leg, initial encounter: Secondary | ICD-10-CM | POA: Diagnosis not present

## 2020-11-09 DIAGNOSIS — M21071 Valgus deformity, not elsewhere classified, right ankle: Secondary | ICD-10-CM | POA: Diagnosis not present

## 2020-11-09 DIAGNOSIS — M76822 Posterior tibial tendinitis, left leg: Secondary | ICD-10-CM | POA: Diagnosis not present

## 2020-11-09 DIAGNOSIS — M21072 Valgus deformity, not elsewhere classified, left ankle: Secondary | ICD-10-CM | POA: Diagnosis not present

## 2020-11-09 DIAGNOSIS — Q666 Other congenital valgus deformities of feet: Secondary | ICD-10-CM | POA: Diagnosis not present

## 2020-11-09 DIAGNOSIS — G8918 Other acute postprocedural pain: Secondary | ICD-10-CM | POA: Diagnosis not present

## 2020-11-09 HISTORY — PX: ARTHRODESIS FOOT WITH WEIL OSTEOTOMY: SHX5589

## 2020-11-09 HISTORY — PX: OTHER SURGICAL HISTORY: SHX169

## 2020-11-09 LAB — CBC
HCT: 34.7 % — ABNORMAL LOW (ref 36.0–46.0)
Hemoglobin: 11.3 g/dL — ABNORMAL LOW (ref 12.0–15.0)
MCH: 30.5 pg (ref 26.0–34.0)
MCHC: 32.6 g/dL (ref 30.0–36.0)
MCV: 93.5 fL (ref 80.0–100.0)
Platelets: 237 10*3/uL (ref 150–400)
RBC: 3.71 MIL/uL — ABNORMAL LOW (ref 3.87–5.11)
RDW: 12.6 % (ref 11.5–15.5)
WBC: 4.3 10*3/uL (ref 4.0–10.5)
nRBC: 0 % (ref 0.0–0.2)

## 2020-11-09 LAB — CREATININE, SERUM
Creatinine, Ser: 0.8 mg/dL (ref 0.44–1.00)
GFR, Estimated: >60 mL/min (ref >60)

## 2020-11-09 LAB — GLUCOSE, CAPILLARY
Glucose-Capillary: 120 mg/dL — ABNORMAL HIGH (ref 70–99)
Glucose-Capillary: 115 mg/dL — ABNORMAL HIGH (ref 70–99)
Glucose-Capillary: 156 mg/dL — ABNORMAL HIGH (ref 70–99)

## 2020-11-09 LAB — SURGICAL PCR SCREEN
MRSA, PCR: NEGATIVE
Staphylococcus aureus: POSITIVE — AB

## 2020-11-09 LAB — ABO/RH: ABO/RH(D): A POS

## 2020-11-09 SURGERY — ARTHRODESIS FOOT WITH WEIL OSTEOTOMY
Anesthesia: General | Laterality: Left

## 2020-11-09 MED ORDER — ONDANSETRON HCL 4 MG PO TABS: 4.0000 mg | ORAL_TABLET | Freq: Four times a day (QID) | ORAL | Status: DC | PRN

## 2020-11-09 MED ORDER — HYDROMORPHONE HCL 1 MG/ML IJ SOLN: 0.5000 mg | INTRAMUSCULAR | Status: DC | PRN

## 2020-11-09 MED ORDER — ENOXAPARIN SODIUM 40 MG/0.4ML ~~LOC~~ SOLN
40.0000 mg | SUBCUTANEOUS | Status: DC
  Administered 2020-11-10 – 2020-11-11 (×2): 40 mg via SUBCUTANEOUS
  Filled 2020-11-09 (×2): qty 0.4

## 2020-11-09 MED ORDER — PROPOFOL 10 MG/ML IV BOLUS
INTRAVENOUS | Status: DC | PRN
  Administered 2020-11-09: 150 mg via INTRAVENOUS

## 2020-11-09 MED ORDER — CEFAZOLIN SODIUM-DEXTROSE 2-4 GM/100ML-% IV SOLN
2.0000 g | INTRAVENOUS | Status: AC
  Administered 2020-11-09: 12:00:00 2 g via INTRAVENOUS
  Filled 2020-11-09: qty 100

## 2020-11-09 MED ORDER — DOCUSATE SODIUM 100 MG PO CAPS
100.0000 mg | ORAL_CAPSULE | Freq: Two times a day (BID) | ORAL | Status: DC
  Filled 2020-11-09 (×4): qty 1

## 2020-11-09 MED ORDER — LIDOCAINE 2% (20 MG/ML) 5 ML SYRINGE
INTRAMUSCULAR | Status: DC | PRN
  Administered 2020-11-09: 60 mg via INTRAVENOUS

## 2020-11-09 MED ORDER — OXYCODONE HCL 5 MG PO TABS: 5.0000 mg | ORAL_TABLET | ORAL | Status: DC | PRN

## 2020-11-09 MED ORDER — HYDROMORPHONE HCL 1 MG/ML IJ SOLN: 0.2500 mg | INTRAMUSCULAR | Status: DC | PRN

## 2020-11-09 MED ORDER — FENTANYL CITRATE (PF) 100 MCG/2ML IJ SOLN
INTRAMUSCULAR | Status: DC | PRN
  Administered 2020-11-09: 25 ug via INTRAVENOUS
  Administered 2020-11-09: 50 ug via INTRAVENOUS

## 2020-11-09 MED ORDER — LAMOTRIGINE 100 MG PO TABS
200.0000 mg | ORAL_TABLET | Freq: Every day | ORAL | Status: DC
Start: 2020-11-09 — End: 2020-11-12
  Administered 2020-11-09: 22:00:00 200 mg via ORAL
  Filled 2020-11-09 (×3): qty 2

## 2020-11-09 MED ORDER — MIDAZOLAM HCL 2 MG/2ML IJ SOLN: 2.0000 mg | Freq: Once | INTRAMUSCULAR | Status: DC

## 2020-11-09 MED ORDER — ACETAMINOPHEN 325 MG PO TABS
325.0000 mg | ORAL_TABLET | Freq: Four times a day (QID) | ORAL | Status: DC | PRN
  Administered 2020-11-11: 650 mg via ORAL
  Filled 2020-11-09: qty 2

## 2020-11-09 MED ORDER — NAPROXEN 250 MG PO TABS
250.0000 mg | ORAL_TABLET | Freq: Two times a day (BID) | ORAL | Status: DC
  Administered 2020-11-09 – 2020-11-11 (×4): 250 mg via ORAL
  Filled 2020-11-09 (×4): qty 1

## 2020-11-09 MED ORDER — METOCLOPRAMIDE HCL 5 MG PO TABS: 5.0000 mg | ORAL_TABLET | Freq: Three times a day (TID) | ORAL | Status: DC | PRN

## 2020-11-09 MED ORDER — DEXAMETHASONE SODIUM PHOSPHATE 4 MG/ML IJ SOLN
INTRAMUSCULAR | Status: DC | PRN
  Administered 2020-11-09: 6 mg via PERINEURAL
  Administered 2020-11-09: 4 mg via PERINEURAL

## 2020-11-09 MED ORDER — FENTANYL CITRATE (PF) 100 MCG/2ML IJ SOLN
INTRAMUSCULAR | Status: AC
  Administered 2020-11-09: 12:00:00 50 ug via INTRAVENOUS
  Filled 2020-11-09: qty 2

## 2020-11-09 MED ORDER — OLANZAPINE 5 MG PO TABS
2.5000 mg | ORAL_TABLET | Freq: Every day | ORAL | Status: DC
  Filled 2020-11-09 (×2): qty 1

## 2020-11-09 MED ORDER — LIDOCAINE 2% (20 MG/ML) 5 ML SYRINGE
INTRAMUSCULAR | Status: AC
  Filled 2020-11-09: qty 5

## 2020-11-09 MED ORDER — ORAL CARE MOUTH RINSE: 15.0000 mL | Freq: Once | OROMUCOSAL | Status: AC

## 2020-11-09 MED ORDER — CLONIDINE HCL (ANALGESIA) 100 MCG/ML EP SOLN
EPIDURAL | Status: DC | PRN
  Administered 2020-11-09: 40 ug
  Administered 2020-11-09: 60 ug

## 2020-11-09 MED ORDER — DIPHENHYDRAMINE HCL 12.5 MG/5ML PO ELIX: 12.5000 mg | ORAL_SOLUTION | ORAL | Status: DC | PRN

## 2020-11-09 MED ORDER — INSULIN ASPART 100 UNIT/ML ~~LOC~~ SOLN: 0.0000 [IU] | Freq: Three times a day (TID) | SUBCUTANEOUS | Status: DC

## 2020-11-09 MED ORDER — EPHEDRINE SULFATE-NACL 50-0.9 MG/10ML-% IV SOSY
PREFILLED_SYRINGE | INTRAVENOUS | Status: DC | PRN
  Administered 2020-11-09 (×2): 10 mg via INTRAVENOUS

## 2020-11-09 MED ORDER — 0.9 % SODIUM CHLORIDE (POUR BTL) OPTIME
TOPICAL | Status: DC | PRN
  Administered 2020-11-09: 13:00:00 1000 mL

## 2020-11-09 MED ORDER — FENTANYL CITRATE (PF) 100 MCG/2ML IJ SOLN
50.0000 ug | Freq: Once | INTRAMUSCULAR | Status: AC
  Filled 2020-11-09: qty 1

## 2020-11-09 MED ORDER — METFORMIN HCL ER 500 MG PO TB24
500.0000 mg | ORAL_TABLET | Freq: Two times a day (BID) | ORAL | Status: DC
Start: 2020-11-09 — End: 2020-11-12
  Administered 2020-11-09 – 2020-11-11 (×4): 500 mg via ORAL
  Filled 2020-11-09 (×6): qty 1

## 2020-11-09 MED ORDER — MUPIROCIN 2 % EX OINT
1.0000 "application " | TOPICAL_OINTMENT | Freq: Two times a day (BID) | CUTANEOUS | Status: DC
  Administered 2020-11-09 – 2020-11-11 (×4): 1 via NASAL
  Filled 2020-11-09 (×3): qty 22

## 2020-11-09 MED ORDER — FLUVOXAMINE MALEATE 50 MG PO TABS
50.0000 mg | ORAL_TABLET | Freq: Two times a day (BID) | ORAL | Status: DC
  Administered 2020-11-09 – 2020-11-11 (×4): 50 mg via ORAL
  Filled 2020-11-09 (×5): qty 1

## 2020-11-09 MED ORDER — OXYCODONE HCL 5 MG PO TABS: 10.0000 mg | ORAL_TABLET | ORAL | Status: DC | PRN

## 2020-11-09 MED ORDER — METOCLOPRAMIDE HCL 5 MG/ML IJ SOLN: 5.0000 mg | Freq: Three times a day (TID) | INTRAMUSCULAR | Status: DC | PRN

## 2020-11-09 MED ORDER — MIDAZOLAM HCL 2 MG/2ML IJ SOLN
INTRAMUSCULAR | Status: AC
  Administered 2020-11-09: 12:00:00 1 mg via INTRAVENOUS
  Filled 2020-11-09: qty 2

## 2020-11-09 MED ORDER — ONDANSETRON HCL 4 MG/2ML IJ SOLN
INTRAMUSCULAR | Status: AC
  Filled 2020-11-09: qty 2

## 2020-11-09 MED ORDER — PROPOFOL 10 MG/ML IV BOLUS
INTRAVENOUS | Status: AC
  Filled 2020-11-09: qty 20

## 2020-11-09 MED ORDER — ACETAMINOPHEN 500 MG PO TABS
1000.0000 mg | ORAL_TABLET | Freq: Four times a day (QID) | ORAL | Status: AC
  Administered 2020-11-09 – 2020-11-10 (×4): 1000 mg via ORAL
  Filled 2020-11-09 (×4): qty 2

## 2020-11-09 MED ORDER — ONDANSETRON HCL 4 MG/2ML IJ SOLN: 4.0000 mg | Freq: Four times a day (QID) | INTRAMUSCULAR | Status: DC | PRN

## 2020-11-09 MED ORDER — PROMETHAZINE HCL 25 MG/ML IJ SOLN: 6.2500 mg | INTRAMUSCULAR | Status: DC | PRN

## 2020-11-09 MED ORDER — BUPIVACAINE-EPINEPHRINE (PF) 0.5% -1:200000 IJ SOLN
INTRAMUSCULAR | Status: DC | PRN
  Administered 2020-11-09: 15 mL via PERINEURAL
  Administered 2020-11-09: 25 mL via PERINEURAL

## 2020-11-09 MED ORDER — LACTATED RINGERS IV SOLN: INTRAVENOUS | Status: DC

## 2020-11-09 MED ORDER — MEPERIDINE HCL 25 MG/ML IJ SOLN: 6.2500 mg | INTRAMUSCULAR | Status: DC | PRN

## 2020-11-09 MED ORDER — MIDAZOLAM HCL 2 MG/2ML IJ SOLN
1.0000 mg | Freq: Once | INTRAMUSCULAR | Status: AC
  Filled 2020-11-09: qty 1

## 2020-11-09 MED ORDER — METHOCARBAMOL 1000 MG/10ML IJ SOLN
500.0000 mg | Freq: Four times a day (QID) | INTRAVENOUS | Status: DC | PRN
  Filled 2020-11-09: qty 5

## 2020-11-09 MED ORDER — CHLORHEXIDINE GLUCONATE 0.12 % MT SOLN
15.0000 mL | Freq: Once | OROMUCOSAL | Status: AC
  Administered 2020-11-09: 11:00:00 15 mL via OROMUCOSAL
  Filled 2020-11-09: qty 15

## 2020-11-09 MED ORDER — PHENYLEPHRINE HCL-NACL 10-0.9 MG/250ML-% IV SOLN
INTRAVENOUS | Status: DC | PRN
  Administered 2020-11-09: 25 ug/min via INTRAVENOUS

## 2020-11-09 MED ORDER — VANCOMYCIN HCL 1000 MG IV SOLR
INTRAVENOUS | Status: AC
  Filled 2020-11-09: qty 1000

## 2020-11-09 MED ORDER — CEFAZOLIN SODIUM-DEXTROSE 1-4 GM/50ML-% IV SOLN
1.0000 g | Freq: Four times a day (QID) | INTRAVENOUS | Status: AC
  Administered 2020-11-09 – 2020-11-10 (×3): 1 g via INTRAVENOUS
  Filled 2020-11-09 (×3): qty 50

## 2020-11-09 MED ORDER — FENTANYL CITRATE (PF) 250 MCG/5ML IJ SOLN
INTRAMUSCULAR | Status: AC
  Filled 2020-11-09: qty 5

## 2020-11-09 MED ORDER — PHENYLEPHRINE 40 MCG/ML (10ML) SYRINGE FOR IV PUSH (FOR BLOOD PRESSURE SUPPORT)
PREFILLED_SYRINGE | INTRAVENOUS | Status: DC | PRN
  Administered 2020-11-09: 80 ug via INTRAVENOUS
  Administered 2020-11-09 (×2): 120 ug via INTRAVENOUS

## 2020-11-09 MED ORDER — METHOCARBAMOL 500 MG PO TABS
500.0000 mg | ORAL_TABLET | Freq: Four times a day (QID) | ORAL | Status: DC | PRN
  Administered 2020-11-10 – 2020-11-11 (×2): 500 mg via ORAL
  Filled 2020-11-09 (×2): qty 1

## 2020-11-09 MED ORDER — PHENYLEPHRINE 40 MCG/ML (10ML) SYRINGE FOR IV PUSH (FOR BLOOD PRESSURE SUPPORT)
PREFILLED_SYRINGE | INTRAVENOUS | Status: AC
  Filled 2020-11-09: qty 10

## 2020-11-09 SURGICAL SUPPLY — 58 items
BANDAGE ESMARK 6X9 LF (GAUZE/BANDAGES/DRESSINGS) ×1 IMPLANT
BIT DRILL CANN STRT 3.2X5 (BIT) ×2 IMPLANT
BIT DRILL PROFILE 5X5 LG (DRILL) ×1 IMPLANT
BLADE LONG MED 31X9 (MISCELLANEOUS) ×2 IMPLANT
BLADE SURG 15 STRL LF DISP TIS (BLADE) ×2 IMPLANT
BLADE SURG 15 STRL SS (BLADE) ×2
BNDG ELASTIC 4X5.8 VLCR STR LF (GAUZE/BANDAGES/DRESSINGS) ×2 IMPLANT
BNDG ELASTIC 6X10 VLCR STRL LF (GAUZE/BANDAGES/DRESSINGS) ×2 IMPLANT
BNDG ELASTIC 6X15 VLCR STRL LF (GAUZE/BANDAGES/DRESSINGS) ×2 IMPLANT
BNDG ESMARK 6X9 LF (GAUZE/BANDAGES/DRESSINGS) ×2
CHLORAPREP W/TINT 26 (MISCELLANEOUS) ×2 IMPLANT
COVER SURGICAL LIGHT HANDLE (MISCELLANEOUS) ×2 IMPLANT
CUFF TOURN SGL QUICK 34 (TOURNIQUET CUFF) ×1
CUFF TRNQT CYL 34X4.125X (TOURNIQUET CUFF) ×1 IMPLANT
DRAPE OEC MINIVIEW 54X84 (DRAPES) ×2 IMPLANT
DRAPE U-SHAPE 47X51 STRL (DRAPES) ×2 IMPLANT
DRILL PROFILE 5X5 LG (DRILL) ×2
DRSG XEROFORM 1X8 (GAUZE/BANDAGES/DRESSINGS) ×2 IMPLANT
ELECT REM PT RETURN 9FT ADLT (ELECTROSURGICAL) ×2
ELECTRODE REM PT RTRN 9FT ADLT (ELECTROSURGICAL) ×1 IMPLANT
GAUZE SPONGE 4X4 12PLY STRL (GAUZE/BANDAGES/DRESSINGS) ×2 IMPLANT
GAUZE SPONGE 4X4 12PLY STRL LF (GAUZE/BANDAGES/DRESSINGS) ×2 IMPLANT
GLOVE BIOGEL M STRL SZ7.5 (GLOVE) ×2 IMPLANT
GLOVE BIOGEL PI IND STRL 8 (GLOVE) ×1 IMPLANT
GLOVE BIOGEL PI INDICATOR 8 (GLOVE) ×1
GOWN STRL REUS W/ TWL LRG LVL3 (GOWN DISPOSABLE) ×1 IMPLANT
GOWN STRL REUS W/ TWL XL LVL3 (GOWN DISPOSABLE) ×1 IMPLANT
GOWN STRL REUS W/TWL LRG LVL3 (GOWN DISPOSABLE) ×1
GOWN STRL REUS W/TWL XL LVL3 (GOWN DISPOSABLE) ×1
GUIDEWIRE TT NITINOL .062X9.25 (WIRE) ×4 IMPLANT
KIT BASIN OR (CUSTOM PROCEDURE TRAY) ×2 IMPLANT
KIT SIZER GRAFT TENODESIS SUTR (KITS) ×2 IMPLANT
NEEDLE TAPERED W/ NITINOL LOOP (MISCELLANEOUS) ×2 IMPLANT
NS IRRIG 1000ML POUR BTL (IV SOLUTION) ×2 IMPLANT
PACK ORTHO EXTREMITY (CUSTOM PROCEDURE TRAY) ×2 IMPLANT
PAD CAST 4YDX4 CTTN HI CHSV (CAST SUPPLIES) ×1 IMPLANT
PADDING CAST COTTON 4X4 STRL (CAST SUPPLIES) ×1
PADDING CAST COTTON 6X4 STRL (CAST SUPPLIES) ×2 IMPLANT
PADDING CAST SYNTHETIC 4 (CAST SUPPLIES) ×1
PADDING CAST SYNTHETIC 4X4 STR (CAST SUPPLIES) ×1 IMPLANT
SCREW COMPR FT LG 5X48 (Screw) ×2 IMPLANT
SCREW LOW COMP HEADLESS 5.0X46 (Screw) ×2 IMPLANT
SPLINT PLASTER CAST XFAST 5X30 (CAST SUPPLIES) ×1 IMPLANT
SPLINT PLASTER XFAST SET 5X30 (CAST SUPPLIES) ×1
SPONGE LAP 18X18 RF (DISPOSABLE) ×2 IMPLANT
SUCTION FRAZIER HANDLE 10FR (MISCELLANEOUS) ×1
SUCTION TUBE FRAZIER 10FR DISP (MISCELLANEOUS) ×1 IMPLANT
SUT ETHILON 3 0 PS 1 (SUTURE) ×4 IMPLANT
SUT FIBERWIRE 2-0 18 17.9 3/8 (SUTURE) ×2
SUT MON AB 3-0 SH 27 (SUTURE) ×1
SUT MON AB 3-0 SH27 (SUTURE) ×1 IMPLANT
SUT VIC AB 2-0 SH 27 (SUTURE) ×1
SUT VIC AB 2-0 SH 27X BRD (SUTURE) ×1 IMPLANT
SUTURE FIBERWR 2-0 18 17.9 3/8 (SUTURE) ×1 IMPLANT
SYSTEM IMPLANT FDL 4.75 (Anchor) ×2 IMPLANT
TOWEL GREEN STERILE FF (TOWEL DISPOSABLE) ×4 IMPLANT
TUBE CONNECTING 20X1/4 (TUBING) ×4 IMPLANT
UNDERPAD 30X36 HEAVY ABSORB (UNDERPADS AND DIAPERS) ×2 IMPLANT

## 2020-11-09 NOTE — Care Management (Signed)
Consult for home health/SNF/DME . Await PT evaluation.   Ronny Flurry RN

## 2020-11-09 NOTE — Transfer of Care (Signed)
Immediate Anesthesia Transfer of Care Note  Patient: Kairi Mcmiller  Procedure(s) Performed: LEFT MEDIAL DISPLACEMENT CALCANEAL OSTEOTOMY, POSTERIOR TIBIAL TENDON DEBRIDEMENT AND TENODESIS, FLEXOR DIGITORUM LONGUS TRANSFER, SPRING LIGAMENT RECONSTRUCTION, POSSIBLE TENDO ACHILLES LENGTHENING (Left )  Patient Location: PACU  Anesthesia Type:General and Regional  Level of Consciousness: awake and alert   Airway & Oxygen Therapy: Patient Spontanous Breathing and Patient connected to face mask oxygen  Post-op Assessment: Report given to RN and Post -op Vital signs reviewed and stable  Post vital signs: Reviewed and stable  Last Vitals:  Vitals Value Taken Time  BP 125/71 11/09/20 1349  Temp    Pulse 83 11/09/20 1351  Resp 13 11/09/20 1351  SpO2 100 % 11/09/20 1351  Vitals shown include unvalidated device data.  Last Pain:  Vitals:   11/09/20 1155  TempSrc:   PainSc: 0-No pain         Complications: No complications documented.

## 2020-11-09 NOTE — Anesthesia Procedure Notes (Signed)
Anesthesia Regional Block: Popliteal block   Pre-Anesthetic Checklist: ,, timeout performed, Correct Patient, Correct Site, Correct Laterality, Correct Procedure, Correct Position, site marked, Risks and benefits discussed,  Surgical consent,  Pre-op evaluation,  At surgeon's request and post-op pain management  Laterality: Left  Prep: chloraprep       Needles:  Injection technique: Single-shot  Needle Type: Stimiplex     Needle Length: 10cm  Needle Gauge: 21     Additional Needles:   Procedures:,,,, ultrasound used (permanent image in chart),,,,  Motor weakness within 5 minutes.   Nerve Stimulator or Paresthesia:  Response: Plantar flexion/toe flexion, 0.5 mA,   Additional Responses:   Narrative:  Start time: 11/09/2020 11:38 AM End time: 11/09/2020 11:43 AM Injection made incrementally with aspirations every 5 mL.  Performed by: Personally  Anesthesiologist: Lewie Loron, MD  Additional Notes: Nerve located and needle positioned with direct ultrasound guidance. Good perineural spread. Patient tolerated well.

## 2020-11-09 NOTE — Op Note (Signed)
Summer Craig female 64 y.o. 11/09/2020  PreOperative Diagnosis: Left posterior tibial tendon rupture Flexible hindfoot valgus  PostOperative Diagnosis: Same  PROCEDURE: Medial displacement calcaneal osteotomy Left posterior tibial tendon debridement Flexor digitorum longus deep tendon transfer to the navicular tuberosity  SURGEON: Dub Mikes, MD  ASSISTANT: Danielle Rankin, OPA-C  (Present and scrubbed throughout the case, critical for assistance with exposure, retraction, placement of hardware, and closure.)     ANESTHESIA: General LMA anesthesia with peripheral nerve blockade  FINDINGS: Completely ruptured posterior tibial tendon at the level of medial malleolus with traction of the proximal segment.  Flexible hindfoot valgus  IMPLANTS: Arthrex headless threaded cannulated screws, 5.0 Bio-Tenodesis screw  INDICATIONS:64 y.o. female had a attritional rupture of her posterior tibial tendon with retraction seen on MRI scan.  She had pain along the medial aspect of her ankle and was developing some subfibular impingement symptoms with progressive collapsing foot deformity.  Patient failed conservative treatment form of boot immobilization, physical therapy and had significant weakness with hindfoot inversion testing.  Patient opted to proceed with surgery.   Patient understood the risks, benefits and alternatives to surgery which include but are not limited to wound healing complications, infection, nonunion, malunion, need for further surgery as well as damage to surrounding structures. They also understood the potential for continued pain in that there were no guarantees of acceptable outcome After weighing these risks the patient opted to proceed with surgery.  PROCEDURE: Patient was identified in the preoperative holding area.  The left leg was marked by myself.  Consent was signed by myself and the patient.  Block was performed by anesthesia in the preoperative  holding area.  Patient was taken to the operative suite and placed supine on the operative table.  General LMA anesthesia was induced without difficulty. Bump was placed under the operative hip and bone foam was used.  All bony prominences were well padded.  Tourniquet was placed on the operative thigh.  Preoperative antibiotics were given. The extremity was prepped and draped in the usual sterile fashion and surgical timeout was performed.  The limb was elevated and the tourniquet was inflated to 250 mmHg.  We began by making a oblique incision along the posterior calcaneal tuberosity on the lateral foot.  This taken sharply down through skin subcutaneous tissue.  Blunt dissection was used to mobilize soft tissues and protect any branch of the sural nerve.  No nerve branches were seen.  Incision was made in the periosteum and this was elevated with a key elevator.  Then Hohmann retractors were placed dorsally and plantarly about the calcaneus in the appropriate position of the osteotomy was confirmed on fluoroscopy.  Then a sagittal saw was used to make an osteotomy through the calcaneus.  Then a key elevator was placed through the osteotomy to elevate the tissues and immobilized calcaneus.  Then a medial displacement was performed and about 6 to 7 mm of displacement was achieved.  Then 2 K wires were placed across the osteotomy site and two 5.0 mm headless compression screws were placed across the osteotomy and with good fixation and bite.  Fluoroscopy confirmed appropriate position and screw length.  A bone tamp was used to tamp down the edges of the osteotomy site to smooth them out.  The wounds then irrigated copiously with normal saline.  The skin was closed in a layered fashion using 3-0 Monocryl 3-0 nylon.  We then turned our attention to the medial side.  An incision was made along the  course the posterior tibial tendon from the medial malleolus down to the navicular tuberosity.  This taken sharply  down through skin subcutaneous tissue.  Skin bleeders were cauterized.  Skin flaps were created plantarly and dorsally.  Then the posterior tibial tendon sheath was identified.  It was incised in line with the posterior tibial tendon.  There is complete rupture of the posterior tibial tendon notable at the level of the medial malleolus.  The distal aspect of the intact tendon was fully mobilized and transected from the medial navicular tuberosity as it was significantly scarred and thickened.  Then proximally the posterior tibial tendon stump was debrided with 15 blade and dissecting scissors and sharply removed from the underlying soft tissues.  The remaining tendinous portion was scarred and there was not enough length to reattach it to the navicular tuberosity.  The decision was made to proceed with flexor digitorum longus tendon transfer.  Then the sheath overlying the flexor digitorum longus tendon was incised in line with the tendon.  The tendon was identified and the sheath was opened up distally down to just proximal to the knot of Mallie Mussel.  Then dissection scissors were used to transect the tendon distally.  The tendon was then grasped with a Coker.  The appropriate attachment site onto the navicular tuberosity was identified.  This was marked on the tendon.  Then using a fiber loop suture the tendon was grasped.  Then the guidepin for the Bio-Tenodesis screw was placed in the appropriate position on the navicular fluoroscopy.  Then the guidepin was run through the navicular through the dorsal foot.  Then using a reamer a hole for the Bio-Tenodesis was created.  Then the tendon was taken through the navicular using the suture material.  The foot was held in a hindfoot inverted position and the tendon was pulled for maximal tightness.  Then the Bio-Tenodesis screw was placed without difficulty.  There was good fixation.  Then using a 2-0 FiberWire suture the overlying periosteum and remaining attachments of  the posterior tibial tendon was transferred to the flexor digitorum longus at the site of the insertion on the navicular for added stability and soft tissue healing.  Then fluoroscopic images were taken and confirmed appropriate position of the Bio-Tenodesis screw no fracturing the navicular.  The wound was irrigated copiously with normal saline.  The wound was closed in a layered fashion.  The posterior tibial tendon sheath was closed using a 2-0 Vicryl stitch.  The skin and subcuticular tissue was closed in a layered fashion using 3-0 Monocryl and 3-0 nylon suture.  The patient was placed into a soft dressing and a short leg nonweightbearing splint.  She tolerated procedure well.  She was awake from anesthesia and taken recovery stable condition.  There were no complications.  Counts were correct at the end the case.  POST OPERATIVE INSTRUCTIONS: Nonweightbearing to operative extremity. Call the office with concerns Keep splint dry She will be admitted for observation to work with physical therapy for nonweightbearing and for disposition She will take Lovenox for DVT prophylaxis while in house and that based on disposition will be discharged on some form of anticoagulant.  TOURNIQUET TIME: Less than 2 hours  BLOOD LOSS:  Minimal         DRAINS: none         SPECIMEN: none       COMPLICATIONS:  * No complications entered in OR log *         Disposition: PACU -  hemodynamically stable.         Condition: stable

## 2020-11-09 NOTE — Anesthesia Postprocedure Evaluation (Signed)
Anesthesia Post Note  Patient: Production manager  Procedure(s) Performed: LEFT MEDIAL DISPLACEMENT CALCANEAL OSTEOTOMY, POSTERIOR TIBIAL TENDON DEBRIDEMENT AND TENODESIS, FLEXOR DIGITORUM LONGUS TRANSFER, SPRING LIGAMENT RECONSTRUCTION, POSSIBLE TENDO ACHILLES LENGTHENING (Left )     Patient location during evaluation: PACU Anesthesia Type: General Level of consciousness: sedated and patient cooperative Pain management: pain level controlled Vital Signs Assessment: post-procedure vital signs reviewed and stable Respiratory status: spontaneous breathing Cardiovascular status: stable Anesthetic complications: no   No complications documented.  Last Vitals:  Vitals:   11/09/20 1405 11/09/20 1420  BP: 126/64 136/80  Pulse: 83 80  Resp: 16 20  Temp:    SpO2: 100% 100%    Last Pain:  Vitals:   11/09/20 1420  TempSrc:   PainSc: 0-No pain                 Lewie Loron

## 2020-11-09 NOTE — Anesthesia Procedure Notes (Signed)
Anesthesia Regional Block: Adductor canal block   Pre-Anesthetic Checklist: ,, timeout performed, Correct Patient, Correct Site, Correct Laterality, Correct Procedure, Correct Position, site marked, Risks and benefits discussed,  Surgical consent,  Pre-op evaluation,  At surgeon's request and post-op pain management  Laterality: Left  Prep: chloraprep       Needles:  Injection technique: Single-shot  Needle Type: Stimiplex     Needle Length: 9cm  Needle Gauge: 21     Additional Needles:   Procedures:,,,, ultrasound used (permanent image in chart),,,,  Narrative:  Start time: 11/09/2020 11:34 AM End time: 11/09/2020 11:38 AM Injection made incrementally with aspirations every 5 mL.  Performed by: Personally  Anesthesiologist: Lewie Loron, MD  Additional Notes: BP cuff, EKG monitors applied. Sedation begun. Artery and nerve location verified with U/S and anesthetic injected incrementally, slowly, and after negative aspirations under direct u/s guidance. Good fascial /perineural spread. Tolerated well.

## 2020-11-09 NOTE — Anesthesia Procedure Notes (Addendum)
Procedure Name: LMA Insertion Date/Time: 11/09/2020 12:09 PM Performed by: Montez Morita, Nello Corro W, CRNA Pre-anesthesia Checklist: Patient identified, Emergency Drugs available, Suction available and Patient being monitored Patient Re-evaluated:Patient Re-evaluated prior to induction Oxygen Delivery Method: Circle system utilized Preoxygenation: Pre-oxygenation with 100% oxygen Induction Type: IV induction Ventilation: Mask ventilation without difficulty LMA: LMA inserted LMA Size: 4.0 Number of attempts: 1 Placement Confirmation: positive ETCO2 and breath sounds checked- equal and bilateral Tube secured with: Tape Dental Injury: Teeth and Oropharynx as per pre-operative assessment

## 2020-11-09 NOTE — H&P (Signed)
PREOPERATIVE H&P  Chief Complaint: Left posterior tibial tendon rupture, flexible pes planovalgus  HPI: Summer Craig is a 64 y.o. female who presents for preoperative history and physical with a diagnosis of left posterior tibial tendon rupture with flexible pes planovalgus.  She has lateral impingement as well.  She had an injury that resulted in a post tib tendon rupture.  She had weakness on testing.  MRI revealed rupture with retraction.. Symptoms are rated as moderate to severe, and have been worsening.  This is significantly impairing activities of daily living.  She has elected for surgical management.   Past Medical History:  Diagnosis Date  . Arthritis   . Bipolar disorder ([place])   . Depression   . Diverticulitis   . DVT (deep venous thrombosis) ([facility])    Left leg- post surgery- per patient  . Family history of polyps in the colon   . Genital warts   . GERD (gastroesophageal reflux disease)    nexiumas needed  . Urine incontinence    Past Surgical History:  Procedure Laterality Date  . bunion removal Left   . BUNIONECTOMY Right   . COLONOSCOPY    . COLONOSCOPY W/ POLYPECTOMY    . KNEE ARTHROSCOPY Left    Menisus tear  . TONSILLECTOMY AND ADENOIDECTOMY     Social History   Socioeconomic History  . Marital status: Divorced    Spouse name: Not on file  . Number of children: Not on file  . Years of education: Not on file  . Highest education level: Not on file  Occupational History  . Not on file  Tobacco Use  . Smoking status: Never Smoker  . Smokeless tobacco: Never Used  Vaping Use  . Vaping Use: Never used  Substance and Sexual Activity  . Alcohol use: Yes    Alcohol/week: 2.0 standard drinks    Types: 2 Glasses of wine per week  . Drug use: Never  . Sexual activity: Not on file  Other Topics Concern  . Not on file  Social History Narrative  . Not on file   Social Determinants of Health   Financial Resource Strain: Not on file  Food Insecurity:  Not on file  Transportation Needs: Not on file  Physical Activity: Not on file  Stress: Not on file  Social Connections: Not on file   Family History  Problem Relation Age of Onset  . Sleep apnea Mother   . Arthritis Mother   . Alzheimer's disease Mother   . Cancer Father        brain  . Alzheimer's disease Maternal Grandmother    No Known Allergies Prior to Admission medications   Medication Sig Start Date End Date Taking? Authorizing Provider  fluvoxaMINE (LUVOX) 50 MG tablet Take 1 tablet (50 mg total) by mouth 2 (two) times daily. 12/12/19  Yes Koberlein, Steele Berg, MD  folic acid (FOLVITE) 1 MG tablet Take 1 mg by mouth daily. 06/03/18  Yes [provider]  lamoTRIgine (LAMICTAL) 200 MG tablet Take 200 mg by mouth daily. 05/01/18  Yes [provider]  metFORMIN (GLUCOPHAGE-XR) 500 MG 24 hr tablet Take 500 mg by mouth 2 (two) times daily. 07/01/18  Yes [provider]  OLANZapine (ZYPREXA) 2.5 MG tablet Take 2.5 mg by mouth at bedtime. 07/13/18  Yes [provider]     Positive ROS: All other systems have been reviewed and were otherwise negative with the exception of those mentioned in the HPI and as above.  Physical Exam:  Vitals:   11/09/20 1030  BP: 130/74  Pulse: 63  Resp: 18  Temp: 98.2 F (36.8 C)  SpO2: 99%   General: Alert, no acute distress Cardiovascular: No pedal edema Respiratory: No cyanosis, no use of accessory musculature GI: No organomegaly, abdomen is soft and non-tender Skin: No lesions in the area of chief complaint Neurologic: Sensation intact distally Psychiatric: Patient is competent for consent with normal mood and affect Lymphatic: No axillary or cervical lymphadenopathy  MUSCULOSKELETAL: Left foot demonstrates tenderness palpation on the medial ankle and hindfoot.  She has weakness with attempted hindfoot inversion against resistance and discomfort.  She does have some tenderness in the subfibular region.  She  has flexible hindfoot.  She has 10 degrees of ankle dorsiflexion with the knee straight in the hindfoot locked in a neutral position.  Sensation grossly intact.  Foot is warm and well-perfused.  Assessment: Left posterior tibial tendon rupture in the setting of flexible pes planovalgus   Plan: Plan for treatment of her ruptured posterior tibial tendon.  We will plan for debridement and tenodesis to the flexor digitorum longus tendon as well as tenodesis of the flexor digitorum longus to the navicular tuberosity and repair of the spring ligament as needed.  We will also perform medial displacement calcaneal osteotomy.  We discussed the risks, benefits and alternatives of surgery which include but are not limited to wound healing complications, infection, nonunion, malunion, need for further surgery, damage to surrounding structures and continued pain.  They understand there is no guarantees to an acceptable outcome.  After weighing these risks they opted to proceed with surgery.     Terance Hart, MD    11/09/2020 11:52 AM

## 2020-11-09 NOTE — Evaluation (Addendum)
Physical Therapy Evaluation Patient Details Name: Summer Craig MRN: [id number] DOB: 1956/05/29 Today's Date: 11/09/2020   History of Present Illness  Pt is a 64 y.o. F with significant PMH of bipolar disorder, DVT, who presents with left posterior tibial tendon rupture s/p medial displacement of calcaneal osteotomy, left posterior tibial tendon debridement, flexor digitorum longus deep tendon transfer to the navicular tuberosity 11/09/2020.  Clinical Impression  Prior to admission, pt lives alone in a town home with a ramped entrance and is independent. Pt presents with decreased functional mobility in setting of weightbearing precautions and balance deficits. Pt reports nerve block still in effect upon PT evaluation. Hopping to bathroom and back with a walker at a min guard assist level; good adherence to precautions with minimal cueing for technique. Pt requesting SNF at discharge due to decreased caregiver support; would benefit from post acute rehab to address deficits and maximize functional independence.     Follow Up Recommendations SNF    Equipment Recommendations  Rolling walker with 5" wheels    Recommendations for Other Services       Precautions / Restrictions Precautions Precautions: Fall Restrictions Weight Bearing Restrictions: Yes LLE Weight Bearing: Non weight bearing      Mobility  Bed Mobility Overal bed mobility: Needs Assistance Bed Mobility: Supine to Sit     Supine to sit: Supervision          Transfers Overall transfer level: Needs assistance Equipment used: Rolling walker (2 wheeled) Transfers: Sit to/from Stand Sit to Stand: Min guard         General transfer comment: Min guard to rise from edge of bed and toilet, cues for technique  Ambulation/Gait Ambulation/Gait assistance: Min guard Gait Distance (Feet): 15 Feet Assistive device: Rolling walker (2 wheeled) Gait Pattern/deviations: Step-to pattern Gait velocity: decreased    General Gait Details: Cues for technique, walker use, holding up LLE to prevent weightbearing. Min guard for Wellsite geologist    Modified Rankin (Stroke Patients Only)       Balance Overall balance assessment: Needs assistance Sitting-balance support: Feet unsupported Sitting balance-Leahy Scale: Good     Standing balance support: Bilateral upper extremity supported Standing balance-Leahy Scale: Poor Standing balance comment: Poor due to NWB status                             Pertinent Vitals/Pain Pain Assessment: Faces Faces Pain Scale: No hurt Pain Location: Nerve block still in effect    Home Living Family/patient expects to be discharged to:: Private residence Living Arrangements: Alone   Type of Home: House Home Access: Ramped entrance     Home Layout: Able to live on main level with bedroom/bathroom Home Equipment: Shower seat      Prior Function Level of Independence: Independent               Hand Dominance        Extremity/Trunk Assessment   Upper Extremity Assessment Upper Extremity Assessment: Overall WFL for tasks assessed    Lower Extremity Assessment Lower Extremity Assessment: LLE deficits/detail LLE Deficits / Details: s/p posterior tibial tendon repair, able to perform limited SLR. Unable to wiggle toes quite yet due to nerve block still being in effect    Cervical / Trunk Assessment Cervical / Trunk Assessment: Normal  Communication   Communication: No difficulties  Cognition Arousal/Alertness: Awake/alert Behavior During Therapy:  WFL for tasks assessed/performed Overall Cognitive Status: Within Functional Limits for tasks assessed                                        General Comments      Exercises     Assessment/Plan    PT Assessment Patient needs continued PT services  PT Problem List Decreased strength;Decreased balance;Decreased mobility        PT Treatment Interventions DME instruction;Gait training;Functional mobility training;Stair training;Therapeutic activities;Therapeutic exercise;Balance training;Patient/family education    PT Goals (Current goals can be found in the Care Plan section)  Acute Rehab PT Goals Patient Stated Goal: go to rehab PT Goal Formulation: With patient Time For Goal Achievement: 11/23/20 Potential to Achieve Goals: Good    Frequency Min 5X/week   Barriers to discharge        Co-evaluation               AM-PAC PT "6 Clicks" Mobility  Outcome Measure Help needed turning from your back to your side while in a flat bed without using bedrails?: None Help needed moving from lying on your back to sitting on the side of a flat bed without using bedrails?: None Help needed moving to and from a bed to a chair (including a wheelchair)?: A Little Help needed standing up from a chair using your arms (e.g., wheelchair or bedside chair)?: A Lot Help needed to walk in hospital room?: A Lot Help needed climbing 3-5 steps with a railing? : A Lot 6 Click Score: 17    End of Session Equipment Utilized During Treatment: Gait belt Activity Tolerance: Patient tolerated treatment well Patient left: in chair;with call bell/phone within reach;with family/visitor present Nurse Communication: Mobility status PT Visit Diagnosis: Difficulty in walking, not elsewhere classified (R26.2);Unsteadiness on feet (R26.81)    Time: 6578-4696 PT Time Calculation (min) (ACUTE ONLY): 33 min   Charges:   PT Evaluation $PT Eval Low Complexity: 1 Low PT Treatments $Gait Training: 8-22 mins        Lillia Pauls, PT, DPT Acute Rehabilitation Services Pager [phone] Office [phone]   Norval Morton 11/09/2020, 5:11 PM

## 2020-11-10 ENCOUNTER — Ambulatory Visit: Payer: BLUE CROSS/BLUE SHIELD | Admitting: Family Medicine

## 2020-11-10 ENCOUNTER — Other Ambulatory Visit: Payer: Self-pay | Admitting: Family Medicine

## 2020-11-10 ENCOUNTER — Telehealth: Payer: Self-pay | Admitting: Family Medicine

## 2020-11-10 ENCOUNTER — Telehealth: Payer: Self-pay | Admitting: *Deleted

## 2020-11-10 DIAGNOSIS — Q666 Other congenital valgus deformities of feet: Secondary | ICD-10-CM | POA: Diagnosis not present

## 2020-11-10 DIAGNOSIS — M67972 Unspecified disorder of synovium and tendon, left ankle and foot: Secondary | ICD-10-CM | POA: Diagnosis not present

## 2020-11-10 LAB — GLUCOSE, CAPILLARY
Glucose-Capillary: 118 mg/dL — ABNORMAL HIGH (ref 70–99)
Glucose-Capillary: 140 mg/dL — ABNORMAL HIGH (ref 70–99)

## 2020-11-10 MED ORDER — OZEMPIC (0.25 OR 0.5 MG/DOSE) 2 MG/1.5ML ~~LOC~~ SOPN: 0.2500 mg | PEN_INJECTOR | SUBCUTANEOUS | 2 refills | Status: DC

## 2020-11-10 NOTE — Evaluation (Signed)
Occupational Therapy Evaluation Patient Details Name: Summer Craig MRN: [id number] DOB: 08-Jul-1956 Today's Date: 11/10/2020    History of Present Illness Pt is a 64 y.o. F with significant PMH of bipolar disorder, DVT, who presents with left posterior tibial tendon rupture s/p medial displacement of calcaneal osteotomy, left posterior tibial tendon debridement, flexor digitorum longus deep tendon transfer to the navicular tuberosity 11/09/2020.   Clinical Impression   Pt presents with decline in function and safety with ADLs and ADL mobility with impaired balance and endurance. Pt lives at home alone and was Ind with ADLs. Mobility and home mgt prior to falling. Pt now requires assist with these functional tasks and is NWB L LE. Pt would benefit from acute OT services to address impairments to maximize level of function and safety    Follow Up Recommendations  SNF    Equipment Recommendations  Other (comment) (TBD at next venue of care)    Recommendations for Other Services       Precautions / Restrictions Precautions Precautions: Fall Restrictions Weight Bearing Restrictions: Yes LLE Weight Bearing: Non weight bearing      Mobility Bed Mobility               General bed mobility comments: Pt OOB with NT upon arrival    Transfers Overall transfer level: Needs assistance Equipment used: Rolling walker (2 wheeled) Transfers: Sit to/from Stand Sit to Stand: Min guard              Balance Overall balance assessment: Needs assistance Sitting-balance support: Feet unsupported Sitting balance-Leahy Scale: Good     Standing balance support: Bilateral upper extremity supported Standing balance-Leahy Scale: Poor Standing balance comment: Poor due to NWB status                           ADL either performed or assessed with clinical judgement   ADL Overall ADL's : Needs assistance/impaired Eating/Feeding: Independent   Grooming: Wash/dry  hands;Wash/dry face;Standing   Upper Body Bathing: Set up;Sitting   Lower Body Bathing: Minimal assistance;Sitting/lateral leans   Upper Body Dressing : Set up;Sitting   Lower Body Dressing: Minimal assistance;Sitting/lateral leans   Toilet Transfer: Min guard;Ambulation;RW;Cueing for safety;Comfort height toilet;Grab bars   Toileting- Clothing Manipulation and Hygiene: Minimal assistance;Sit to/from stand       Functional mobility during ADLs: Min guard;Rolling walker;Cueing for safety       Vision Baseline Vision/History: Wears glasses Patient Visual Report: No change from baseline       Perception     Praxis      Pertinent Vitals/Pain Pain Assessment: 0-10 Faces Pain Scale: Hurts a little bit Pain Location: L LE/ankle Pain Descriptors / Indicators: Aching;Operative site guarding;Sore Pain Intervention(s): Monitored during session;Repositioned;Premedicated before session     Hand Dominance Right   Extremity/Trunk Assessment Upper Extremity Assessment Upper Extremity Assessment: Overall WFL for tasks assessed   Lower Extremity Assessment Lower Extremity Assessment: Defer to PT evaluation   Cervical / Trunk Assessment Cervical / Trunk Assessment: Normal   Communication Communication Communication: No difficulties   Cognition Arousal/Alertness: Awake/alert Behavior During Therapy: WFL for tasks assessed/performed Overall Cognitive Status: Within Functional Limits for tasks assessed                                     General Comments       Exercises  Shoulder Instructions      Home Living Family/patient expects to be discharged to:: Private residence Living Arrangements: Alone   Type of Home: House Home Access: Ramped entrance     Home Layout: Able to live on main level with bedroom/bathroom     Bathroom Shower/Tub: Producer, television/film/video: Standard Bathroom Accessibility: Yes   Home Equipment: Shower seat           Prior Functioning/Environment Level of Independence: Independent                 OT Problem List: Decreased activity tolerance;Decreased coordination;Decreased knowledge of use of DME or AE;Impaired balance (sitting and/or standing)      OT Treatment/Interventions: Self-care/ADL training;Therapeutic exercise;DME and/or AE instruction;Patient/family education;Balance training    OT Goals(Current goals can be found in the care plan section) Acute Rehab OT Goals Patient Stated Goal: go to rehab OT Goal Formulation: With patient Time For Goal Achievement: 11/24/20 Potential to Achieve Goals: Fair ADL Goals Pt Will Perform Grooming: with supervision;with set-up;standing Pt Will Perform Lower Body Bathing: with min assist;with min guard assist;with adaptive equipment;sit to/from stand Pt Will Perform Lower Body Dressing: with min assist;with min guard assist;sit to/from stand;with adaptive equipment Pt Will Transfer to Toilet: with supervision;ambulating;regular height toilet;grab bars Pt Will Perform Toileting - Clothing Manipulation and hygiene: with min guard assist;with supervision;sit to/from stand  OT Frequency: Min 2X/week   Barriers to D/C:            Co-evaluation              AM-PAC OT "6 Clicks" Daily Activity     Outcome Measure Help from another person eating meals?: None Help from another person taking care of personal grooming?: A Little Help from another person toileting, which includes using toliet, bedpan, or urinal?: A Little Help from another person bathing (including washing, rinsing, drying)?: A Little Help from another person to put on and taking off regular upper body clothing?: None Help from another person to put on and taking off regular lower body clothing?: A Little 6 Click Score: 20   End of Session Equipment Utilized During Treatment: Gait belt;Rolling walker  Activity Tolerance: Patient tolerated treatment well Patient left: in  chair;with call bell/phone within reach  OT Visit Diagnosis: Unsteadiness on feet (R26.81);Other abnormalities of gait and mobility (R26.89);History of falling (Z91.81);Pain Pain - Right/Left: Left Pain - part of body: Ankle and joints of foot;Leg                Time: 2505-3976 OT Time Calculation (min): 29 min Charges:  OT General Charges $OT Visit: 1 Visit OT Evaluation $OT Eval Low Complexity: 1 Low OT Treatments $Self Care/Home Management : 8-22 mins    Galen Manila 11/10/2020, 2:49 PM

## 2020-11-10 NOTE — Care Management (Addendum)
       RE: Summer Craig Date of Birth: [birth year] Date: 11/10/20   To Whom It May Concern:  Please be advised that the above-named patient will require a short-term nursing home stay - anticipated 30 days or less for rehabilitation and strengthening.  The plan is for return home.

## 2020-11-10 NOTE — Telephone Encounter (Signed)
-----   Message from Wynn Banker, MD sent at 11/10/2020  4:25 PM EST -----  I just got off phone with patient. I Had sent message on her earlier, but I don't think you got to it yet.   Can you please mail patient copy of diabetic handout and write at top: "try to limit self to 3 carb choices max/meal. Each carb choice = 15g carbohydrates".  Also call her to reschedule missed appointment today since she is in hospital. 1-2 months out is good for this. Thanks!

## 2020-11-10 NOTE — Progress Notes (Signed)
Physical Therapy Note    11/10/20 1200   PT - Assessment/Plan  PT Plan Frequency needs to be updated  PT Visit Diagnosis Difficulty in walking, not elsewhere classified (R26.2);Unsteadiness on feet (R26.81)  PT Frequency (ACUTE ONLY) Min 3X/week  Follow Up Recommendations SNF  PT equipment Rolling walker with 5" wheels   Updated frequency based on plan for dc to SNF per departmental protocols.  Van Clines, [place]  Acute Rehabilitation Services Pager [phone] Office [phone]

## 2020-11-10 NOTE — Telephone Encounter (Signed)
Patient is calling and stated that she had some labs done and wanted to see if she was prediabetic, please advise. CB is [phone]

## 2020-11-10 NOTE — Telephone Encounter (Signed)
She was supposed to have visit with me today, but looks like she cancelled. I was going to review bloodwork at that time. Yes, A1C now looks like she is prediabetic. Would suggest low carb diet, regular exercise. Please help with rescheduling visit. Thanks!

## 2020-11-10 NOTE — Plan of Care (Signed)

## 2020-11-10 NOTE — Telephone Encounter (Signed)
Spoke with the pt and scheduled an appt for 12/15/2020.  Patient is aware the diabetic diet info will be mailed to the home address.

## 2020-11-10 NOTE — NC FL2 (Signed)
[place] MEDICAID FL2 LEVEL OF CARE SCREENING TOOL     IDENTIFICATION  Patient Name: Summer Craig Birthdate: [birth year] Sex: female Admission Date (Current Location): 11/09/2020  [facility] and [place] Number:  Herbalist and Address:  [facility], [place] [address] [place], [place] [address]      Provider Number: [id number]  Attending Physician Name and Address:  Erle Crocker, MD  Relative Name and Phone Number:       Current Level of Care: Hospital Recommended Level of Care: [place] Prior Approval Number:    Date Approved/Denied:   PASRR Number: pending  Discharge Plan: SNF    Current Diagnoses: Patient Active Problem List   Diagnosis Date Noted  . Posterior tibial tendon dysfunction (PTTD) of left lower extremity 11/09/2020  . Bipolar disorder ([place]) 12/12/2019  . Hiatal hernia 12/12/2019  . Diverticulosis 12/12/2019  . Posterior tibial tendonitis of right leg 07/24/2018    Orientation RESPIRATION BLADDER Height & Weight     Self,Time,Situation,Place  Normal Continent Weight: 72.1 kg Height:  5' 5.5" (166.4 cm)  BEHAVIORAL SYMPTOMS/MOOD NEUROLOGICAL BOWEL NUTRITION STATUS      Continent Diet  AMBULATORY STATUS COMMUNICATION OF NEEDS Skin   Extensive Assist Verbally Surgical wounds (Ace wrap and cast to left lower ext)                       Personal Care Assistance Level of Assistance  Bathing,Dressing Bathing Assistance: Limited assistance   Dressing Assistance: Limited assistance     Functional Limitations Info             SPECIAL CARE FACTORS FREQUENCY  PT (By licensed PT),OT (By licensed OT)     PT Frequency: five times a week OT Frequency: five times a week            Contractures Contractures Info: Present    Additional Factors Info  Code Status,Allergies,Insulin Sliding Scale,Psychotropic Code Status Info: full code Allergies Info: no known allergies Psychotropic  Info: Zyprexa 2.5 mg daily, lamictal 200 mg daily , luvox 50 mg tabd  BID Insulin Sliding Scale Info: Insulin novolog 0 to 15 units sq TID, Glucophage XR 24 hour tab 500mg  daily ,       Current Medications (11/10/2020):  This is the current hospital active medication list Current Facility-Administered Medications  Medication Dose Route Frequency Provider Last Rate Last Admin  . acetaminophen (TYLENOL) tablet 1,000 mg  1,000 mg Oral Q6H Erle Crocker, MD   1,000 mg at 11/10/20 0602  . acetaminophen (TYLENOL) tablet 325-650 mg  325-650 mg Oral Q6H PRN Erle Crocker, MD      . diphenhydrAMINE (BENADRYL) 12.5 MG/5ML elixir 12.5-25 mg  12.5-25 mg Oral Q4H PRN Erle Crocker, MD      . docusate sodium (COLACE) capsule 100 mg  100 mg Oral BID Erle Crocker, MD      . enoxaparin (LOVENOX) injection 40 mg  40 mg Subcutaneous Q24H Erle Crocker, MD   40 mg at 11/10/20 0836  . fluvoxaMINE (LUVOX) tablet 50 mg  50 mg Oral BID Erle Crocker, MD   50 mg at 11/09/20 2134  . HYDROmorphone (DILAUDID) injection 0.5-1 mg  0.5-1 mg Intravenous Q4H PRN Erle Crocker, MD      . insulin aspart (novoLOG) injection 0-15 Units  0-15 Units Subcutaneous TID WC Erle Crocker, MD      . lamoTRIgine (LAMICTAL) tablet  200 mg  200 mg Oral Daily Terance Hart, MD   200 mg at 11/09/20 2138  . metFORMIN (GLUCOPHAGE-XR) 24 hr tablet 500 mg  500 mg Oral BID WC Terance Hart, MD   500 mg at 11/10/20 0836  . methocarbamol (ROBAXIN) tablet 500 mg  500 mg Oral Q6H PRN Terance Hart, MD       Or  . methocarbamol (ROBAXIN) 500 mg in dextrose 5 % 50 mL IVPB  500 mg Intravenous Q6H PRN Terance Hart, MD      . metoCLOPramide (REGLAN) tablet 5-10 mg  5-10 mg Oral Q8H PRN Terance Hart, MD       Or  . metoCLOPramide (REGLAN) injection 5-10 mg  5-10 mg Intravenous Q8H PRN Terance Hart, MD      . mupirocin ointment (BACTROBAN) 2 % 1 application   1 application Nasal BID Terance Hart, MD   1 application at 11/09/20 2140  . naproxen (NAPROSYN) tablet 250 mg  250 mg Oral BID WC Terance Hart, MD   250 mg at 11/10/20 0836  . OLANZapine (ZYPREXA) tablet 2.5 mg  2.5 mg Oral QHS Terance Hart, MD      . ondansetron [facility]) tablet 4 mg  4 mg Oral Q6H PRN Terance Hart, MD       Or  . ondansetron [facility]) injection 4 mg  4 mg Intravenous Q6H PRN Terance Hart, MD      . oxyCODONE (Oxy IR/ROXICODONE) immediate release tablet 10-15 mg  10-15 mg Oral Q4H PRN Terance Hart, MD      . oxyCODONE (Oxy IR/ROXICODONE) immediate release tablet 5-10 mg  5-10 mg Oral Q4H PRN Terance Hart, MD         Discharge Medications: Please see discharge summary for a list of discharge medications.  Relevant Imaging Results:  Relevant Lab Results:   Additional Information SS: [id number], non weight bearing to left leg, Covid immuization 02/12/20 and 03/08/20  Verina Galeno, Adria Devon, RN

## 2020-11-10 NOTE — Telephone Encounter (Signed)
Yes; I sent you a note. I took care of lab review with her. She just needs follow up visit set up and diabetic handout mailed to her. Thanks!

## 2020-11-10 NOTE — TOC Initial Note (Addendum)
Transition of Care [facility]) - Initial/Assessment Note    Patient Details  Name: Summer Craig MRN: [id number] Date of Birth: [birth year]  [facility] of [facility]) CM/SW Contact:    Marilu Favre, RN Phone Number: 11/10/2020, 11:39 AM  Clinical Narrative:                  Patient from home alone. Requesting short term rehab prior to returning home.   Have submitted for PASRR.  Patient in agreement to be faxed out to [facility] and surrounding areas . Once NCM has bed offers will provide them to patient.   SNF will submit for insurance authorization.   Patient aware and voiced understanding to all of above   Messaged Dr Lucia Gaskins to sign FL2 and 30 day note   Patient now wants in home care, explained private duty is something she and family would need to arrange. Patient provided NCM with agencies to call. NCM will call in am since it is after 5 pm now.  Expected Discharge Plan: [place]     Patient Goals and CMS Choice Patient states their goals for this hospitalization and ongoing recovery are:: to go to SNF for short term rehab CMS Medicare.gov Compare Post Acute Care list provided to:: Patient Choice offered to / list presented to : Patient  Expected Discharge Plan and Services Expected Discharge Plan: [place]   Discharge Planning Services: CM Consult Post Acute Care Choice: [place] Living arrangements for the past 2 months: Single Family Home                 DME Arranged: N/A         HH Arranged: NA          Prior Living Arrangements/Services Living arrangements for the past 2 months: Single Family Home Lives with:: Self Patient language and need for interpreter reviewed:: Yes Do you feel safe going back to the place where you live?: No   no assistance , once has SNF then plans to return  Need for Family Participation in Patient Care: Yes (Comment) Care giver support system in place?: No (comment)    Criminal Activity/Legal Involvement Pertinent to Current Situation/Hospitalization: No - Comment as needed  Activities of Daily Living Home Assistive Devices/Equipment: Eyeglasses ADL Screening (condition at time of admission) Patient's cognitive ability adequate to safely complete daily activities?: Yes Is the patient deaf or have difficulty hearing?: No Does the patient have difficulty seeing, even when wearing glasses/contacts?: No Does the patient have difficulty concentrating, remembering, or making decisions?: No Patient able to express need for assistance with ADLs?: Yes Does the patient have difficulty dressing or bathing?: No Independently performs ADLs?: Yes (appropriate for developmental age) Does the patient have difficulty walking or climbing stairs?: Yes Weakness of Legs: Left Weakness of Arms/Hands: None  Permission Sought/Granted   Permission granted to share information with : No              Emotional Assessment Appearance:: Appears stated age     Orientation: : Oriented to Self,Oriented to Place,Oriented to  Time,Oriented to Situation Alcohol / Substance Use: Not Applicable Psych Involvement: No (comment)  Admission diagnosis:  Posterior tibial tendon dysfunction (PTTD) of left lower extremity [id number] Patient Active Problem List   Diagnosis Date Noted  . Posterior tibial tendon dysfunction (PTTD) of left lower extremity 11/09/2020  . Bipolar disorder ([place]) 12/12/2019  . Hiatal hernia 12/12/2019  . Diverticulosis 12/12/2019  . Posterior tibial tendonitis of  right leg 07/24/2018   PCP:  Wynn Banker, MD Pharmacy:   [facility] [phone] [place] Otto, [place] - [address] AT [facility] [address] [facility] [address] [place] [address] Phone: [phone] Fax: [phone]     Social Determinants of Health (SDOH) Interventions    Readmission Risk Interventions No flowsheet data found.

## 2020-11-10 NOTE — Telephone Encounter (Signed)
Patient would like for Summer Craig to contact her with the results for her A1C and she has questions about being prediabetic or not being prediabetic.  Please advise

## 2020-11-10 NOTE — Progress Notes (Signed)
     Summer Craig is a 64 y.o. female   Orthopaedic diagnosis:Status post left posterior tibial tendon debridement with calcaneal osteotomy and FDL transfer  Subjective: Patient is resting comfortably.  Block is still in effect.  Denies pain.  Mobilized some with physical therapy postoperatively for transfers.  Their recommendation is SNF placement.  Objectyive: Vitals:   11/10/20 0257 11/10/20 0601  BP: (!) 95/51 (!) 99/58  Pulse: 62 60  Resp: 16 16  Temp: 98.1 F (36.7 C) 98 F (36.7 C)  SpO2: 96% 96%     Exam: Awake and alert Respirations even and unlabored No acute distress  Short leg splint in place.  Decreased sensation about the forefoot.  Swelling present.  No strikethrough.  Toes are warm and well perfused.  Assessment: Postop day 1 status post above, doing well.   Plan: We will await further physical therapy recommendations and care management assessment. On weightbearing to operative extremity Lovenox for DVT prophylaxis Regular diet Pain control as ordered.   Nicki Gu[place], MD

## 2020-11-10 NOTE — Progress Notes (Signed)
Patient is requesting to stop blood sugar checks, she wants to speak to her PCP before receiving any insulin as well.

## 2020-11-11 ENCOUNTER — Encounter (HOSPITAL_COMMUNITY): Payer: Self-pay | Admitting: Orthopaedic Surgery

## 2020-11-11 DIAGNOSIS — M67972 Unspecified disorder of synovium and tendon, left ankle and foot: Secondary | ICD-10-CM | POA: Diagnosis not present

## 2020-11-11 DIAGNOSIS — M76822 Posterior tibial tendinitis, left leg: Secondary | ICD-10-CM | POA: Diagnosis not present

## 2020-11-11 DIAGNOSIS — Q666 Other congenital valgus deformities of feet: Secondary | ICD-10-CM | POA: Diagnosis not present

## 2020-11-11 MED ORDER — OXYCODONE HCL 5 MG PO TABS: 5.0000 mg | ORAL_TABLET | ORAL | 0 refills | Status: AC | PRN

## 2020-11-11 MED ORDER — ASPIRIN 325 MG PO TABS: 325.0000 mg | ORAL_TABLET | Freq: Every day | ORAL | 11 refills | Status: DC

## 2020-11-11 NOTE — Progress Notes (Addendum)
Physical Therapy Treatment Patient Details Name: Summer Craig MRN: [id number] DOB: 08-May-1956 Today's Date: 11/11/2020    History of Present Illness Pt is a 64 y.o. F with significant PMH of bipolar disorder, DVT, who presents with left posterior tibial tendon rupture s/p medial displacement of calcaneal osteotomy, left posterior tibial tendon debridement, flexor digitorum longus deep tendon transfer to the navicular tuberosity 11/09/2020.    PT Comments    Pt seated in chair, agreeable to therapy session, with good participation. Session limited to seated/reclined LLE exercises as pt preparing for discharge, but agreeable to review/perform LLE exercises for strengthening and education. Pt compliant with LLE NWB restriction throughout with good carryover of instruction on form and able to perform SLR unassisted with good quad contraction. HEP link: ([email or web address] Access Code: [email or web address]). Pt continues to benefit from PT services to progress toward functional mobility goals. D/C recs below remain appropriate, although pt reports she has more assist now and plans to go home.   Follow Up Recommendations  SNF (although per chart review pt now plans for home)     Equipment Recommendations  Rolling walker with 5" wheels    Recommendations for Other Services       Precautions / Restrictions Precautions Precautions: Fall Restrictions Weight Bearing Restrictions: Yes LLE Weight Bearing: Non weight bearing    Mobility  Bed Mobility               General bed mobility comments: pt up in chair pre/post session  Transfers                 General transfer comment: pt defers as she is preparing for DC, seated exercises only  Ambulation/Gait                 Stairs             Wheelchair Mobility    Modified Rankin (Stroke Patients Only)       Balance Overall balance assessment: Needs assistance Sitting-balance support: Feet  unsupported Sitting balance-Leahy Scale: Good Sitting balance - Comments: static sitting and weight shifting forward in chair without back support no LOB                                    Cognition Arousal/Alertness: Awake/alert Behavior During Therapy: WFL for tasks assessed/performed Overall Cognitive Status: Within Functional Limits for tasks assessed                                        Exercises General Exercises - Lower Extremity Quad Sets: AROM;Strengthening;Left;5 reps;Supine Long Arc Quad: AROM;Strengthening;Left;5 reps;Seated Heel Slides: AROM;Strengthening;Left;5 reps;Other (comment) (reclined in chair) Straight Leg Raises: AROM;Strengthening;Left;5 reps;Supine;Other (comment) (reclined in chair) Hip Flexion/Marching: AROM;Strengthening;Left;5 reps;Seated Other Exercises Other Exercises: seated BUE AROM: shoulder shrugs/rolls, seated wheelchair push-ups (using RLE and BUE only) 1x5 reps    General Comments General comments (skin integrity, edema, etc.): pt without complaint during session, session limited to chair to review HEP, pt preparing for discharge and deferred further mobility, per chart review pt has been at supervision/min guard level for mobility in room and able to verbalize NWB LLE restriction      Pertinent Vitals/Pain Pain Assessment: No/denies pain Pain Intervention(s): Monitored during session    Home Living Family/patient expects to be discharged to:: Private residence Living Arrangements: Alone  Prior Function            PT Goals (current goals can now be found in the care plan section) Acute Rehab PT Goals Patient Stated Goal: go to rehab PT Goal Formulation: With patient Time For Goal Achievement: 11/23/20 Potential to Achieve Goals: Good Progress towards PT goals: Progressing toward goals    Frequency    Min 3X/week      PT Plan Current plan remains appropriate     Co-evaluation              AM-PAC PT "6 Clicks" Mobility   Outcome Measure  Help needed turning from your back to your side while in a flat bed without using bedrails?: None Help needed moving from lying on your back to sitting on the side of a flat bed without using bedrails?: None Help needed moving to and from a bed to a chair (including a wheelchair)?: A Little Help needed standing up from a chair using your arms (e.g., wheelchair or bedside chair)?: A Little Help needed to walk in hospital room?: A Little Help needed climbing 3-5 steps with a railing? : A Lot 6 Click Score: 19    End of Session   Activity Tolerance: Patient tolerated treatment well Patient left: in chair;with call bell/phone within reach;with family/visitor present Nurse Communication: Mobility status PT Visit Diagnosis: Difficulty in walking, not elsewhere classified (R26.2);Unsteadiness on feet (R26.81)     Time: 0086-7619 PT Time Calculation (min) (ACUTE ONLY): 13 min  Charges:  $Therapeutic Exercise: 8-22 mins                     Arnez Stoneking P., PTA Acute Rehabilitation Services Pager: [phone] Office: [phone]   [place] 11/11/2020, 4:56 PM

## 2020-11-11 NOTE — Telephone Encounter (Signed)
See prior message

## 2020-11-11 NOTE — Discharge Summary (Signed)
Patient ID: Summer Craig MRN: [id number] DOB/AGE: 1956/07/09 64 y.o.  Admit date: 11/09/2020 Discharge date: 11/11/2020  Admission Diagnoses:  Active Problems:   Posterior tibial tendon dysfunction (PTTD) of left lower extremity   Discharge Diagnoses:  Same  Past Medical History:  Diagnosis Date  . Arthritis   . Bipolar disorder (HCC)   . Depression   . Diverticulitis   . DVT (deep venous thrombosis) (HCC)    Left leg- post surgery- per patient  . Family history of polyps in the colon   . Genital warts   . GERD (gastroesophageal reflux disease)    nexiumas needed  . Urine incontinence     Surgeries: Procedure(s): LEFT MEDIAL DISPLACEMENT CALCANEAL OSTEOTOMY, POSTERIOR TIBIAL TENDON DEBRIDEMENT AND TENODESIS, FLEXOR DIGITORUM LONGUS TRANSFER, SPRING LIGAMENT RECONSTRUCTION, POSSIBLE TENDO ACHILLES LENGTHENING on 11/09/2020   Consultants: None  Discharged Condition: Improved  Hospital Course: Summer Craig is an 64 y.o. female who was admitted 11/09/2020 for operative treatment of PTTD. Patient has severe unremitting pain that affects sleep, daily activities, and work/hobbies. After pre-op clearance the patient was taken to the operating room on 11/09/2020 and underwent  Procedure(s): LEFT MEDIAL DISPLACEMENT CALCANEAL OSTEOTOMY, POSTERIOR TIBIAL TENDON DEBRIDEMENT AND TENODESIS, FLEXOR DIGITORUM LONGUS TRANSFER, SPRING LIGAMENT RECONSTRUCTION, POSSIBLE TENDO ACHILLES LENGTHENING.    She did well postoperatively.  Her pain was controlled.  She opted to be discharged home with home health nursing.  Patient was given perioperative antibiotics:  Anti-infectives (From admission, onward)   Start     Dose/Rate Route Frequency Ordered Stop   11/09/20 1800  ceFAZolin (ANCEF) IVPB 1 g/50 mL premix        1 g 100 mL/hr over 30 Minutes Intravenous Every 6 hours 11/09/20 1531 11/10/20 0632   11/09/20 1030  ceFAZolin (ANCEF) IVPB 2g/100 mL premix        2 g 200 mL/hr over 30  Minutes Intravenous On call to O.R. 11/09/20 1016 11/09/20 1213       Patient was given sequential compression devices, early ambulation, and chemoprophylaxis to prevent DVT.  Patient benefited maximally from hospital stay and there were no complications.    Recent vital signs:  Patient Vitals for the past 24 hrs:  BP Temp Temp src Pulse Resp SpO2  11/11/20 1528 121/66 98.3 F (36.8 C) Oral 71 18 98 %  11/11/20 0528 (!) 116/57 98.3 F (36.8 C) Oral 64 17 98 %  11/10/20 2033 (!) 120/57 98.2 F (36.8 C) Oral (!) 59 18 100 %     Recent laboratory studies:  Recent Labs    11/09/20 1552  WBC 4.3  HGB 11.3*  HCT 34.7*  PLT 237  CREATININE 0.80     Discharge Medications:   Allergies as of 11/11/2020   No Known Allergies     Medication List    TAKE these medications   aspirin 325 MG tablet Commonly known as: Bayer Aspirin Take 1 tablet (325 mg total) by mouth daily.   fluvoxaMINE 50 MG tablet Commonly known as: LUVOX Take 1 tablet (50 mg total) by mouth 2 (two) times daily.   folic acid 1 MG tablet Commonly known as: FOLVITE Take 1 mg by mouth daily.   lamoTRIgine 200 MG tablet Commonly known as: LAMICTAL Take 200 mg by mouth daily.   metFORMIN 500 MG 24 hr tablet Commonly known as: GLUCOPHAGE-XR Take 500 mg by mouth 2 (two) times daily.   OLANZapine 2.5 MG tablet Commonly known as: ZYPREXA Take 2.5 mg by mouth at  bedtime.   oxyCODONE 5 MG immediate release tablet Commonly known as: Oxy IR/ROXICODONE Take 1-2 tablets (5-10 mg total) by mouth every 4 (four) hours as needed for up to 7 days for moderate pain (pain score 4-6).   Ozempic (0.25 or 0.5 MG/DOSE) 2 MG/1.5ML Sopn Generic drug: Semaglutide(0.25 or 0.5MG /DOS) Inject 0.25 mg into the skin once a week.            Durable Medical Equipment  (From admission, onward)         Start     Ordered   11/11/20 1443  For home use only DME Walker rolling  Once       Question Answer Comment  Walker:  With 5 Inch Wheels   Patient needs a walker to treat with the following condition Weakness      11/11/20 1442          Diagnostic Studies: DG MINI C-ARM IMAGE ONLY  Result Date: 11/09/2020 There is no interpretation for this exam.  This order is for images obtained during a surgical procedure.  Please See "Surgeries" Tab for more information regarding the procedure.    Disposition: Discharge disposition: 01-Home or Self Care       Discharge Instructions    Call MD / Call 911   Complete by: As directed    If you experience chest pain or shortness of breath, CALL 911 and be transported to the hospital emergency room.  If you develope a fever above 101 F, pus (white drainage) or increased drainage or redness at the wound, or calf pain, call your surgeon's office.   Constipation Prevention   Complete by: As directed    Drink plenty of fluids.  Prune juice may be helpful.  You may use a stool softener, such as Colace (over the counter) 100 mg twice a day.  Use MiraLax (over the counter) for constipation as needed.   Diet - low sodium heart healthy   Complete by: As directed    Increase activity slowly as tolerated   Complete by: As directed    NWB operative leg       Follow-up Information    [facility] Follow up.   Specialty: Home Health Services Contact information: [address] [place] [address] [phone]                Signed: Terance Hart 11/11/2020, 3:40 PM

## 2020-11-11 NOTE — TOC Progression Note (Addendum)
Transition of Care [facility]) - Progression Note    Patient Details  Name: Summer Craig MRN: [id number] Date of Birth: 06/22/1956  [facility] CM/SW Contact  Jacalyn Lefevre Edson Snowball, RN Phone Number: 11/11/2020, 1:05 PM  Clinical Narrative:     Patient was told by [facility] customer service she has benefit of private duty nursing.  NCM called [place] customer service confirmed patient has that benefit, however could not tell NCM if criteria patient would need to met to receive benefit.    NCM was directed to [place] assistance 1 [phone] option 1, ncm called spoke to [facility] and was directed back to customer service.   NCM made referral to [facility] private duty nursing they will "run "patient's policy.   Patient aware. Patient has spoken to someone at [facility] and was given [phone] . NCM called spoke to [place].  Patient is requesting 8 hours a day of private duty nursing , seven days a week.  Transferred to [employer]. Was instructed to fax clinicals to [facility]. It will take 3 business days for appeal. Tomorrow 11/12/20 is a holiday. Patient and Dr Lucia Gaskins aware. Patient states she wants to be discharged today and she has help at home. Patient aware [employer] will take up to 3 business days and 12/31 is a holiday, to make a determination. Once patient is discharge she will need to follow up with [employer] and [place] . Patient voices understanding. Fax [phone] . All requested information faxed. Received confirmation fax was received.  HHPT arranged with [facility]. Ordered walker with [facility] . Patient has 3 in 1 at home.   Expected Discharge Plan: [place]    Expected Discharge Plan and Services Expected Discharge Plan: [place]   Discharge Planning Services: CM Consult Post Acute Care Choice: [place] Living arrangements for the past 2 months: Single Family Home                 DME Arranged: N/A         HH Arranged: NA            Social Determinants of Health (SDOH) Interventions    Readmission Risk Interventions No flowsheet data found.

## 2020-11-11 NOTE — Progress Notes (Signed)
Discharge instructions (including medications) discussed with and copy provided to patient/caregiver  Patient is ready to go home and no questions at the moment.

## 2020-11-11 NOTE — Plan of Care (Signed)
  Problem: Education: Goal: Knowledge of General Education information will improve Description: Including pain rating scale, medication(s)/side effects and non-pharmacologic comfort measures Outcome: Progressing   Problem: Education: Goal: Knowledge of General Education information will improve Description: Including pain rating scale, medication(s)/side effects and non-pharmacologic comfort measures Outcome: Progressing   Problem: Health Behavior/Discharge Planning: Goal: Ability to manage health-related needs will improve Outcome: Progressing   Problem: Clinical Measurements: Goal: Ability to maintain clinical measurements within normal limits will improve Outcome: Progressing Goal: Will remain free from infection Outcome: Progressing Goal: Diagnostic test results will improve Outcome: Progressing Goal: Respiratory complications will improve Outcome: Progressing Goal: Cardiovascular complication will be avoided Outcome: Progressing   Problem: Activity: Goal: Risk for activity intolerance will decrease Outcome: Progressing   Problem: Nutrition: Goal: Adequate nutrition will be maintained Outcome: Progressing   Problem: Coping: Goal: Level of anxiety will decrease Outcome: Progressing   Problem: Elimination: Goal: Will not experience complications related to bowel motility Outcome: Progressing Goal: Will not experience complications related to urinary retention Outcome: Progressing   Problem: Pain Managment: Goal: General experience of comfort will improve Outcome: Progressing   Problem: Safety: Goal: Ability to remain free from injury will improve Outcome: Progressing   Problem: Skin Integrity: Goal: Risk for impaired skin integrity will decrease Outcome: Progressing   Problem: Education: Goal: Required Educational Video(s) Outcome: Progressing   Problem: Clinical Measurements: Goal: Postoperative complications will be avoided or minimized Outcome:  Progressing   Problem: Skin Integrity: Goal: Demonstration of wound healing without infection will improve Outcome: Progressing

## 2020-11-13 HISTORY — PX: KNEE ARTHROSCOPY: SUR90

## 2020-11-19 DIAGNOSIS — F3175 Bipolar disorder, in partial remission, most recent episode depressed: Secondary | ICD-10-CM | POA: Diagnosis not present

## 2020-11-22 DIAGNOSIS — S86112A Strain of other muscle(s) and tendon(s) of posterior muscle group at lower leg level, left leg, initial encounter: Secondary | ICD-10-CM | POA: Diagnosis not present

## 2020-12-15 ENCOUNTER — Other Ambulatory Visit: Payer: Self-pay

## 2020-12-15 ENCOUNTER — Encounter: Payer: Self-pay | Admitting: Family Medicine

## 2020-12-15 ENCOUNTER — Ambulatory Visit (INDEPENDENT_AMBULATORY_CARE_PROVIDER_SITE_OTHER): Payer: BLUE CROSS/BLUE SHIELD | Admitting: Family Medicine

## 2020-12-15 VITALS — BP 100/60 | HR 71 | Temp 96.6°F | Ht 65.5 in

## 2020-12-15 DIAGNOSIS — E785 Hyperlipidemia, unspecified: Secondary | ICD-10-CM

## 2020-12-15 DIAGNOSIS — F3170 Bipolar disorder, currently in remission, most recent episode unspecified: Secondary | ICD-10-CM

## 2020-12-15 DIAGNOSIS — K449 Diaphragmatic hernia without obstruction or gangrene: Secondary | ICD-10-CM | POA: Diagnosis not present

## 2020-12-15 DIAGNOSIS — K5790 Diverticulosis of intestine, part unspecified, without perforation or abscess without bleeding: Secondary | ICD-10-CM

## 2020-12-15 DIAGNOSIS — R7301 Impaired fasting glucose: Secondary | ICD-10-CM

## 2020-12-15 NOTE — Progress Notes (Signed)
Hannie Shoe DOB: 15-Jun-1956 Encounter date: 12/15/2020  This is a 65 y.o. female who presents with Chief Complaint  Patient presents with  . Follow-up    History of present illness: Last visit with me was 02/2020.  Since that time she has had Achilles tendon surgery. She is doing well recovering.   States that she has lost weight; not trying, but portions are smaller. Not exercising. She has help daily; meal prep, self care, laundry. She is doing ok with the ozempic; she is tolerating this well.   Colonoscopy due 10/2029  Has seen dermatology in past; tries to follow up with them regularly.   Gynecologist: Pamala Hurry; mammogram not done this year. Scheduled for march this year.   Mood has been stable; done well. Great support from neighbors.   No Known Allergies Current Meds  Medication Sig  . fluvoxaMINE (LUVOX) 50 MG tablet Take 1 tablet (50 mg total) by mouth 2 (two) times daily.  . folic acid (FOLVITE) 1 MG tablet Take 1 mg by mouth daily.  Marland Kitchen lamoTRIgine (LAMICTAL) 200 MG tablet Take 200 mg by mouth daily.  . metFORMIN (GLUCOPHAGE-XR) 500 MG 24 hr tablet Take 500 mg by mouth 2 (two) times daily.  Marland Kitchen OLANZapine (ZYPREXA) 2.5 MG tablet Take 2.5 mg by mouth at bedtime.  . Semaglutide,0.25 or 0.5MG /DOS, (OZEMPIC, 0.25 OR 0.5 MG/DOSE,) 2 MG/1.5ML SOPN Inject 0.25 mg into the skin once a week.    Review of Systems  Constitutional: Negative for chills, fatigue and fever.  Respiratory: Negative for cough, chest tightness, shortness of breath and wheezing.   Cardiovascular: Negative for chest pain, palpitations and leg swelling.  Musculoskeletal:       She is non weight bearing    Objective:  BP 100/60 (BP Location: Left Arm, Patient Position: Sitting, Cuff Size: Normal)   Pulse 71   Temp (!) 96.6 F (35.9 C) (Axillary)   Ht 5' 5.5" (1.664 m)   BMI 26.06 kg/m       BP Readings from Last 3 Encounters:  12/15/20 100/60  11/11/20 121/66  11/02/20 134/81   Wt Readings  from Last 3 Encounters:  11/09/20 159 lb (72.1 kg)  11/02/20 162 lb 9.6 oz (73.8 kg)  03/12/20 159 lb 4.8 oz (72.3 kg)    Physical Exam Constitutional:      General: She is not in acute distress.    Appearance: She is well-developed.  Cardiovascular:     Rate and Rhythm: Normal rate and regular rhythm.     Heart sounds: Normal heart sounds. No murmur heard. No friction rub.  Pulmonary:     Effort: Pulmonary effort is normal. No respiratory distress.     Breath sounds: Normal breath sounds. No wheezing or rales.  Musculoskeletal:     Right lower leg: No edema.     Left lower leg: No edema.  Skin:    Comments: She has some echymosis on ribs for leaning against counters  Neurological:     Mental Status: She is alert and oriented to person, place, and time.  Psychiatric:        Behavior: Behavior normal.     Assessment/Plan  1. Bipolar disorder in full remission, most recent episode unspecified type ([place]) Mood has been stable.   2. Hiatal hernia No issues with acid reflux.   3. Diverticulosis Doing well with bowel movements.   4. Impaired fasting glucose Has done well on metformin and ozempic. Consider stopping after next A1C in march. - Hemoglobin A1c;  Future  5. Hyperlipidemia, unspecified hyperlipidemia type Will recheck with future bloodwork. - Lipid panel; Future   Return for return for lab visit end of march.   Micheline Rough, MD

## 2020-12-22 DIAGNOSIS — S86112D Strain of other muscle(s) and tendon(s) of posterior muscle group at lower leg level, left leg, subsequent encounter: Secondary | ICD-10-CM | POA: Diagnosis not present

## 2020-12-23 DIAGNOSIS — F319 Bipolar disorder, unspecified: Secondary | ICD-10-CM | POA: Diagnosis not present

## 2020-12-28 DIAGNOSIS — S86112D Strain of other muscle(s) and tendon(s) of posterior muscle group at lower leg level, left leg, subsequent encounter: Secondary | ICD-10-CM | POA: Diagnosis not present

## 2021-01-06 DIAGNOSIS — S86112D Strain of other muscle(s) and tendon(s) of posterior muscle group at lower leg level, left leg, subsequent encounter: Secondary | ICD-10-CM | POA: Diagnosis not present

## 2021-01-11 DIAGNOSIS — F319 Bipolar disorder, unspecified: Secondary | ICD-10-CM | POA: Diagnosis not present

## 2021-01-11 DIAGNOSIS — S86112D Strain of other muscle(s) and tendon(s) of posterior muscle group at lower leg level, left leg, subsequent encounter: Secondary | ICD-10-CM | POA: Diagnosis not present

## 2021-01-13 DIAGNOSIS — S86112D Strain of other muscle(s) and tendon(s) of posterior muscle group at lower leg level, left leg, subsequent encounter: Secondary | ICD-10-CM | POA: Diagnosis not present

## 2021-01-18 DIAGNOSIS — S86112D Strain of other muscle(s) and tendon(s) of posterior muscle group at lower leg level, left leg, subsequent encounter: Secondary | ICD-10-CM | POA: Diagnosis not present

## 2021-01-20 DIAGNOSIS — S86112D Strain of other muscle(s) and tendon(s) of posterior muscle group at lower leg level, left leg, subsequent encounter: Secondary | ICD-10-CM | POA: Diagnosis not present

## 2021-01-25 DIAGNOSIS — Z1231 Encounter for screening mammogram for malignant neoplasm of breast: Secondary | ICD-10-CM | POA: Diagnosis not present

## 2021-01-25 DIAGNOSIS — S86112D Strain of other muscle(s) and tendon(s) of posterior muscle group at lower leg level, left leg, subsequent encounter: Secondary | ICD-10-CM | POA: Diagnosis not present

## 2021-01-27 DIAGNOSIS — S86112D Strain of other muscle(s) and tendon(s) of posterior muscle group at lower leg level, left leg, subsequent encounter: Secondary | ICD-10-CM | POA: Diagnosis not present

## 2021-02-02 DIAGNOSIS — S86112D Strain of other muscle(s) and tendon(s) of posterior muscle group at lower leg level, left leg, subsequent encounter: Secondary | ICD-10-CM | POA: Diagnosis not present

## 2021-02-08 DIAGNOSIS — S86112D Strain of other muscle(s) and tendon(s) of posterior muscle group at lower leg level, left leg, subsequent encounter: Secondary | ICD-10-CM | POA: Diagnosis not present

## 2021-02-08 DIAGNOSIS — F319 Bipolar disorder, unspecified: Secondary | ICD-10-CM | POA: Diagnosis not present

## 2021-02-10 ENCOUNTER — Other Ambulatory Visit: Payer: BLUE CROSS/BLUE SHIELD

## 2021-02-10 ENCOUNTER — Other Ambulatory Visit (INDEPENDENT_AMBULATORY_CARE_PROVIDER_SITE_OTHER): Payer: BLUE CROSS/BLUE SHIELD

## 2021-02-10 ENCOUNTER — Other Ambulatory Visit: Payer: Self-pay

## 2021-02-10 DIAGNOSIS — S86112D Strain of other muscle(s) and tendon(s) of posterior muscle group at lower leg level, left leg, subsequent encounter: Secondary | ICD-10-CM | POA: Diagnosis not present

## 2021-02-10 DIAGNOSIS — R7301 Impaired fasting glucose: Secondary | ICD-10-CM

## 2021-02-10 DIAGNOSIS — E785 Hyperlipidemia, unspecified: Secondary | ICD-10-CM

## 2021-02-11 LAB — LIPID PANEL
Cholesterol: 214 mg/dL — ABNORMAL HIGH (ref 0–200)
HDL: 67.4 mg/dL (ref >39.00)
LDL Cholesterol: 127 mg/dL — ABNORMAL HIGH (ref 0–99)
NonHDL: 146.47
Total CHOL/HDL Ratio: 3
Triglycerides: 95 mg/dL (ref 0.0–149.0)
VLDL: 19 mg/dL (ref 0.0–40.0)

## 2021-02-11 LAB — HEMOGLOBIN A1C: Hgb A1c MFr Bld: 5.5 % (ref 4.6–6.5)

## 2021-02-15 DIAGNOSIS — F3175 Bipolar disorder, in partial remission, most recent episode depressed: Secondary | ICD-10-CM | POA: Diagnosis not present

## 2021-02-15 DIAGNOSIS — Z6825 Body mass index (BMI) 25.0-25.9, adult: Secondary | ICD-10-CM | POA: Diagnosis not present

## 2021-02-15 DIAGNOSIS — B977 Papillomavirus as the cause of diseases classified elsewhere: Secondary | ICD-10-CM | POA: Diagnosis not present

## 2021-02-15 DIAGNOSIS — Z01419 Encounter for gynecological examination (general) (routine) without abnormal findings: Secondary | ICD-10-CM | POA: Diagnosis not present

## 2021-02-16 DIAGNOSIS — S86112D Strain of other muscle(s) and tendon(s) of posterior muscle group at lower leg level, left leg, subsequent encounter: Secondary | ICD-10-CM | POA: Diagnosis not present

## 2021-02-18 DIAGNOSIS — S86112D Strain of other muscle(s) and tendon(s) of posterior muscle group at lower leg level, left leg, subsequent encounter: Secondary | ICD-10-CM | POA: Diagnosis not present

## 2021-02-21 ENCOUNTER — Other Ambulatory Visit: Payer: Self-pay | Admitting: Family Medicine

## 2021-02-21 MED ORDER — OZEMPIC (0.25 OR 0.5 MG/DOSE) 2 MG/1.5ML ~~LOC~~ SOPN: 0.5000 mg | PEN_INJECTOR | SUBCUTANEOUS | 1 refills | Status: DC

## 2021-02-23 DIAGNOSIS — S86112D Strain of other muscle(s) and tendon(s) of posterior muscle group at lower leg level, left leg, subsequent encounter: Secondary | ICD-10-CM | POA: Diagnosis not present

## 2021-03-01 DIAGNOSIS — S86112D Strain of other muscle(s) and tendon(s) of posterior muscle group at lower leg level, left leg, subsequent encounter: Secondary | ICD-10-CM | POA: Diagnosis not present

## 2021-03-03 DIAGNOSIS — S86112D Strain of other muscle(s) and tendon(s) of posterior muscle group at lower leg level, left leg, subsequent encounter: Secondary | ICD-10-CM | POA: Diagnosis not present

## 2021-03-08 DIAGNOSIS — F319 Bipolar disorder, unspecified: Secondary | ICD-10-CM | POA: Diagnosis not present

## 2021-03-08 DIAGNOSIS — S86112D Strain of other muscle(s) and tendon(s) of posterior muscle group at lower leg level, left leg, subsequent encounter: Secondary | ICD-10-CM | POA: Diagnosis not present

## 2021-03-10 DIAGNOSIS — M1712 Unilateral primary osteoarthritis, left knee: Secondary | ICD-10-CM | POA: Diagnosis not present

## 2021-03-10 DIAGNOSIS — M25562 Pain in left knee: Secondary | ICD-10-CM | POA: Diagnosis not present

## 2021-03-11 DIAGNOSIS — B977 Papillomavirus as the cause of diseases classified elsewhere: Secondary | ICD-10-CM | POA: Diagnosis not present

## 2021-03-11 DIAGNOSIS — R87810 Cervical high risk human papillomavirus (HPV) DNA test positive: Secondary | ICD-10-CM | POA: Diagnosis not present

## 2021-03-11 DIAGNOSIS — N951 Menopausal and female climacteric states: Secondary | ICD-10-CM | POA: Diagnosis not present

## 2021-03-15 DIAGNOSIS — N3946 Mixed incontinence: Secondary | ICD-10-CM | POA: Diagnosis not present

## 2021-03-15 DIAGNOSIS — N3943 Post-void dribbling: Secondary | ICD-10-CM | POA: Diagnosis not present

## 2021-03-15 DIAGNOSIS — M6281 Muscle weakness (generalized): Secondary | ICD-10-CM | POA: Diagnosis not present

## 2021-04-20 DIAGNOSIS — M94262 Chondromalacia, left knee: Secondary | ICD-10-CM | POA: Diagnosis not present

## 2021-04-20 DIAGNOSIS — Y999 Unspecified external cause status: Secondary | ICD-10-CM | POA: Diagnosis not present

## 2021-04-20 DIAGNOSIS — S83242A Other tear of medial meniscus, current injury, left knee, initial encounter: Secondary | ICD-10-CM | POA: Diagnosis not present

## 2021-04-20 DIAGNOSIS — X58XXXA Exposure to other specified factors, initial encounter: Secondary | ICD-10-CM | POA: Diagnosis not present

## 2021-04-20 DIAGNOSIS — S83272A Complex tear of lateral meniscus, current injury, left knee, initial encounter: Secondary | ICD-10-CM | POA: Diagnosis not present

## 2021-04-20 DIAGNOSIS — S83282A Other tear of lateral meniscus, current injury, left knee, initial encounter: Secondary | ICD-10-CM | POA: Diagnosis not present

## 2021-04-20 DIAGNOSIS — M6752 Plica syndrome, left knee: Secondary | ICD-10-CM | POA: Diagnosis not present

## 2021-04-20 DIAGNOSIS — G8918 Other acute postprocedural pain: Secondary | ICD-10-CM | POA: Diagnosis not present

## 2021-04-26 DIAGNOSIS — Z9889 Other specified postprocedural states: Secondary | ICD-10-CM | POA: Diagnosis not present

## 2021-04-26 DIAGNOSIS — M25662 Stiffness of left knee, not elsewhere classified: Secondary | ICD-10-CM | POA: Diagnosis not present

## 2021-04-29 DIAGNOSIS — M25662 Stiffness of left knee, not elsewhere classified: Secondary | ICD-10-CM | POA: Diagnosis not present

## 2021-04-29 DIAGNOSIS — R531 Weakness: Secondary | ICD-10-CM | POA: Diagnosis not present

## 2021-05-02 DIAGNOSIS — M25662 Stiffness of left knee, not elsewhere classified: Secondary | ICD-10-CM | POA: Diagnosis not present

## 2021-05-02 DIAGNOSIS — R531 Weakness: Secondary | ICD-10-CM | POA: Diagnosis not present

## 2021-05-13 DIAGNOSIS — R531 Weakness: Secondary | ICD-10-CM | POA: Diagnosis not present

## 2021-05-13 DIAGNOSIS — M25662 Stiffness of left knee, not elsewhere classified: Secondary | ICD-10-CM | POA: Diagnosis not present

## 2021-05-17 DIAGNOSIS — N3943 Post-void dribbling: Secondary | ICD-10-CM | POA: Diagnosis not present

## 2021-05-17 DIAGNOSIS — N3946 Mixed incontinence: Secondary | ICD-10-CM | POA: Diagnosis not present

## 2021-05-17 DIAGNOSIS — M6281 Muscle weakness (generalized): Secondary | ICD-10-CM | POA: Diagnosis not present

## 2021-05-17 DIAGNOSIS — M25662 Stiffness of left knee, not elsewhere classified: Secondary | ICD-10-CM | POA: Diagnosis not present

## 2021-05-17 DIAGNOSIS — R531 Weakness: Secondary | ICD-10-CM | POA: Diagnosis not present

## 2021-05-24 DIAGNOSIS — N3946 Mixed incontinence: Secondary | ICD-10-CM | POA: Diagnosis not present

## 2021-05-24 DIAGNOSIS — M6281 Muscle weakness (generalized): Secondary | ICD-10-CM | POA: Diagnosis not present

## 2021-05-24 DIAGNOSIS — N3943 Post-void dribbling: Secondary | ICD-10-CM | POA: Diagnosis not present

## 2021-05-31 DIAGNOSIS — N3943 Post-void dribbling: Secondary | ICD-10-CM | POA: Diagnosis not present

## 2021-05-31 DIAGNOSIS — N3946 Mixed incontinence: Secondary | ICD-10-CM | POA: Diagnosis not present

## 2021-05-31 DIAGNOSIS — M6281 Muscle weakness (generalized): Secondary | ICD-10-CM | POA: Diagnosis not present

## 2021-06-15 DIAGNOSIS — N3946 Mixed incontinence: Secondary | ICD-10-CM | POA: Diagnosis not present

## 2021-06-15 DIAGNOSIS — M6281 Muscle weakness (generalized): Secondary | ICD-10-CM | POA: Diagnosis not present

## 2021-06-15 DIAGNOSIS — N3943 Post-void dribbling: Secondary | ICD-10-CM | POA: Diagnosis not present

## 2021-07-13 DIAGNOSIS — M6281 Muscle weakness (generalized): Secondary | ICD-10-CM | POA: Diagnosis not present

## 2021-07-13 DIAGNOSIS — N3946 Mixed incontinence: Secondary | ICD-10-CM | POA: Diagnosis not present

## 2021-07-13 DIAGNOSIS — N3943 Post-void dribbling: Secondary | ICD-10-CM | POA: Diagnosis not present

## 2021-07-21 DIAGNOSIS — M6281 Muscle weakness (generalized): Secondary | ICD-10-CM | POA: Diagnosis not present

## 2021-07-21 DIAGNOSIS — N3946 Mixed incontinence: Secondary | ICD-10-CM | POA: Diagnosis not present

## 2021-07-21 DIAGNOSIS — N3943 Post-void dribbling: Secondary | ICD-10-CM | POA: Diagnosis not present

## 2021-07-29 IMAGING — CT CT ABD-PELV W/O CM
2 of 4 series · 15 of 46 positions shown, 17 images · non-contrast
Comparison: None.

CLINICAL DATA: Abdominal pain, diverticulitis suspected

EXAM:
CT ABDOMEN AND PELVIS WITHOUT CONTRAST
TECHNIQUE: Multidetector CT imaging of the abdomen and pelvis was performed
following the standard protocol without IV contrast.

[Series 3: a/p w/o 5mm · axial · non-contrast · 0.87mm/px · z∈[-511,-101]mm · 12 of 92 slices shown, 14 images]
[im 5/92  soft-tissue]
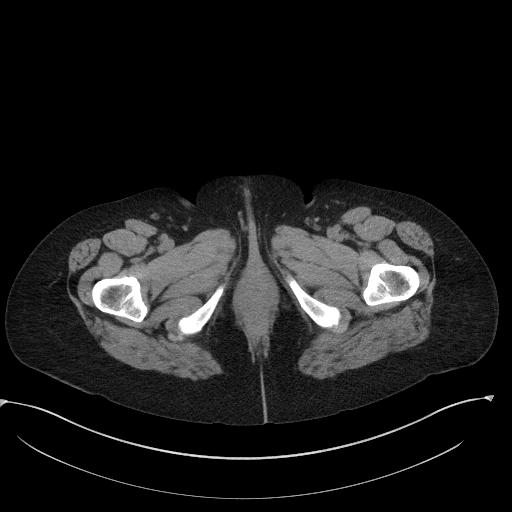
[im 5/92  bone]
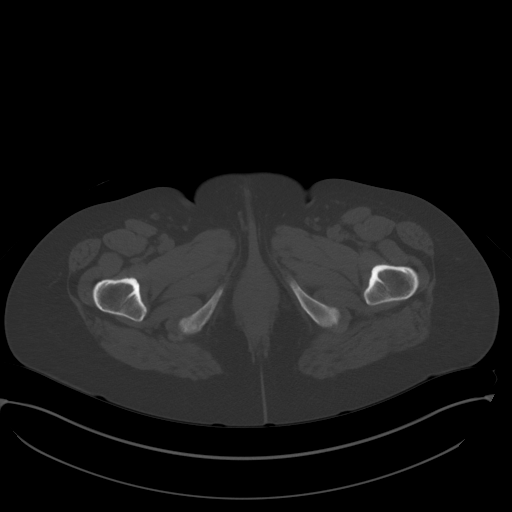
[im 13/92  soft-tissue]
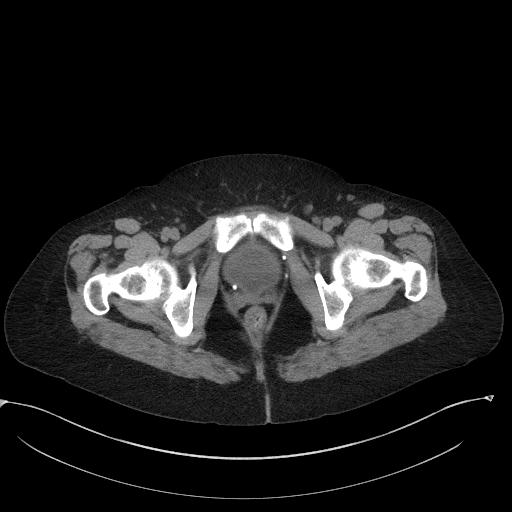
[im 21/92  soft-tissue]
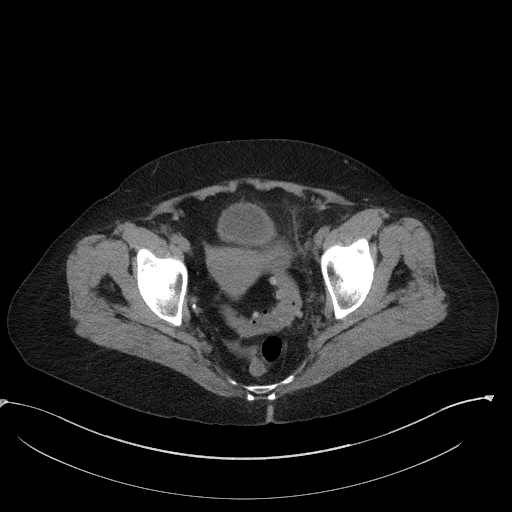
[im 29/92  soft-tissue]
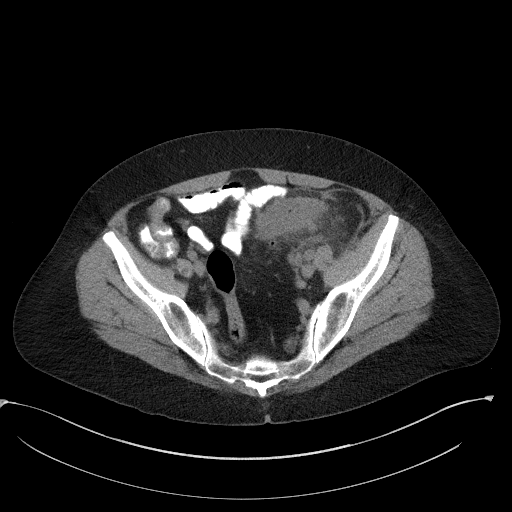
[im 34/92  soft-tissue]
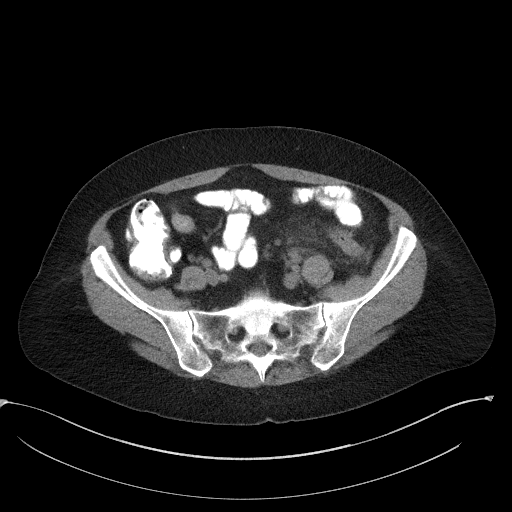
[im 42/92  soft-tissue]
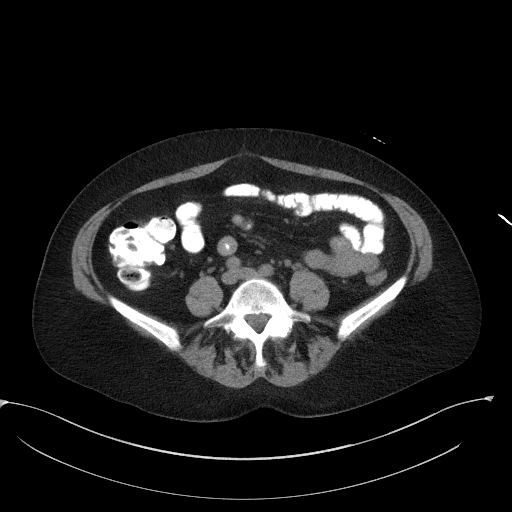
[im 50/92  soft-tissue]
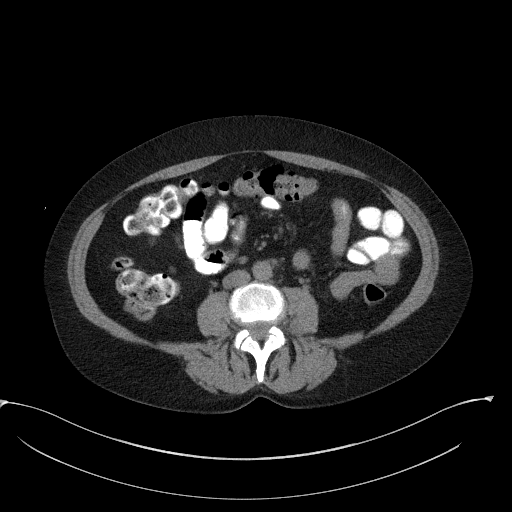
[im 58/92  soft-tissue]
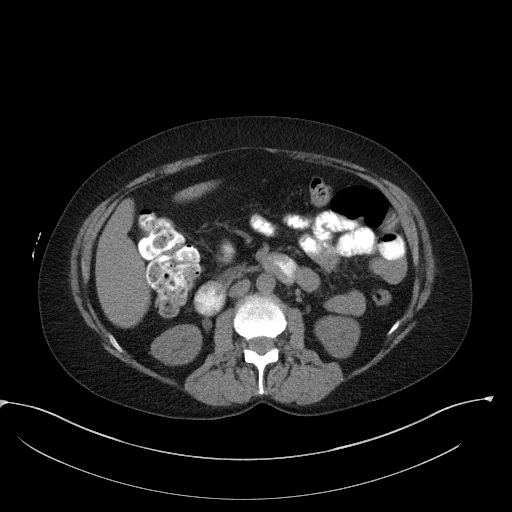
[im 63/92  soft-tissue]
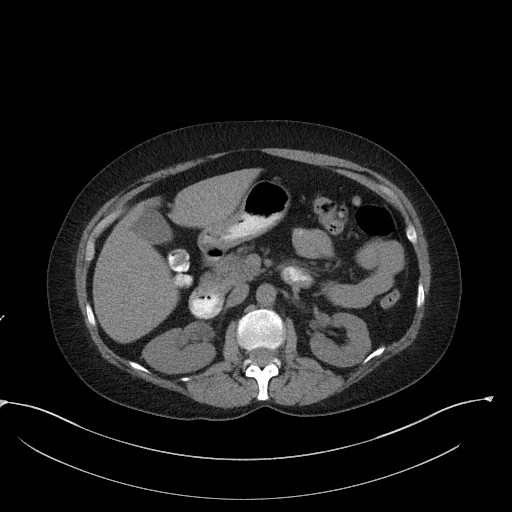
[im 63/92  bone]
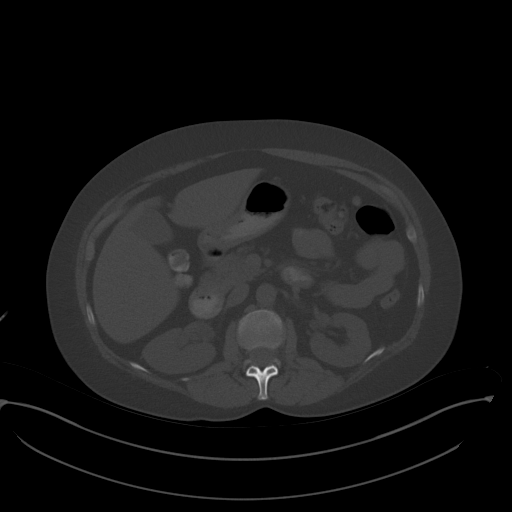
[im 71/92  soft-tissue]
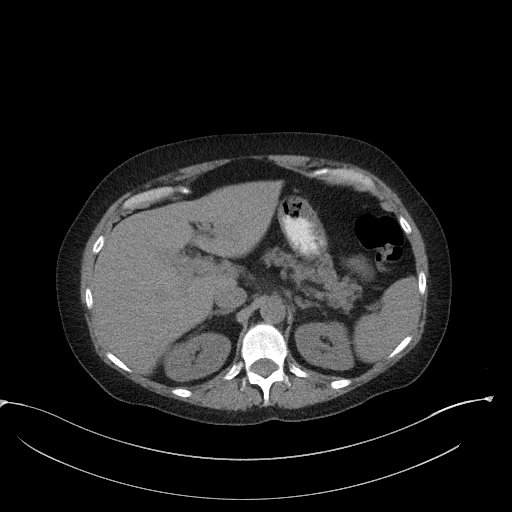
[im 79/92  soft-tissue]
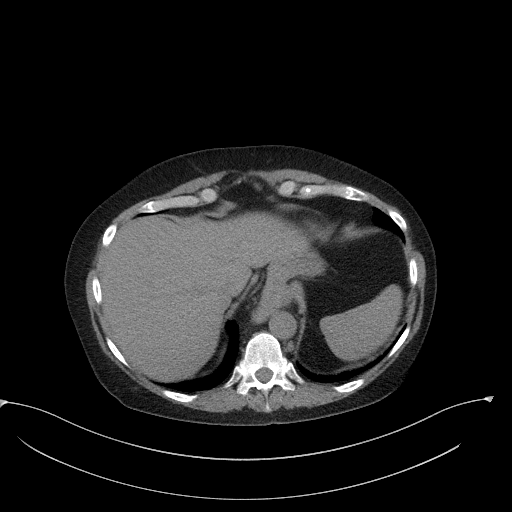
[im 87/92  soft-tissue]
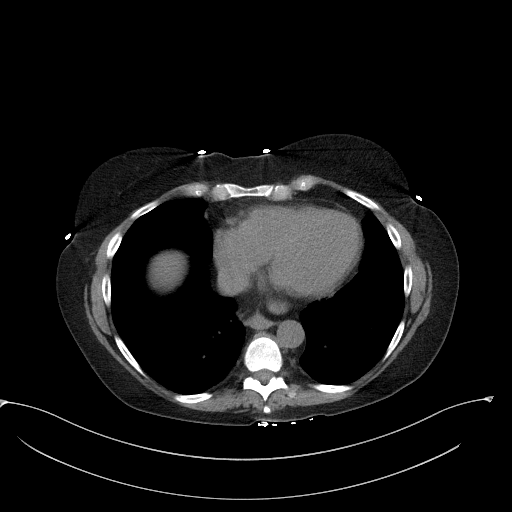

[Series 6: a/p w/o cor · coronal · non-contrast · 0.67mm/px · 3 of 128 slices shown]
[im 43/128  soft-tissue]
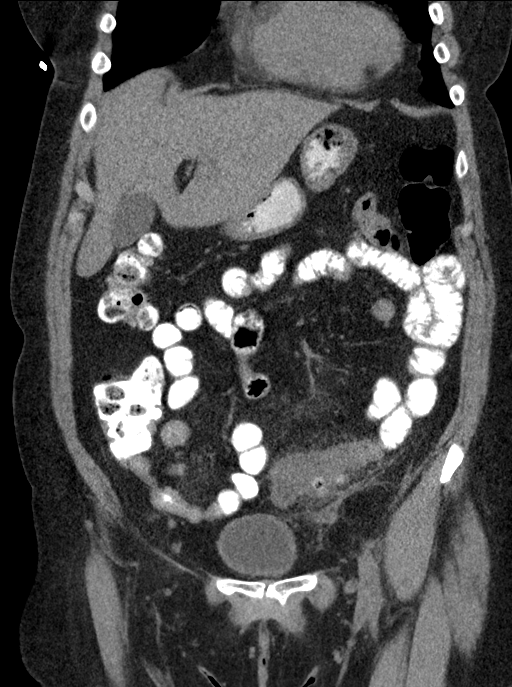
[im 57/128  soft-tissue]
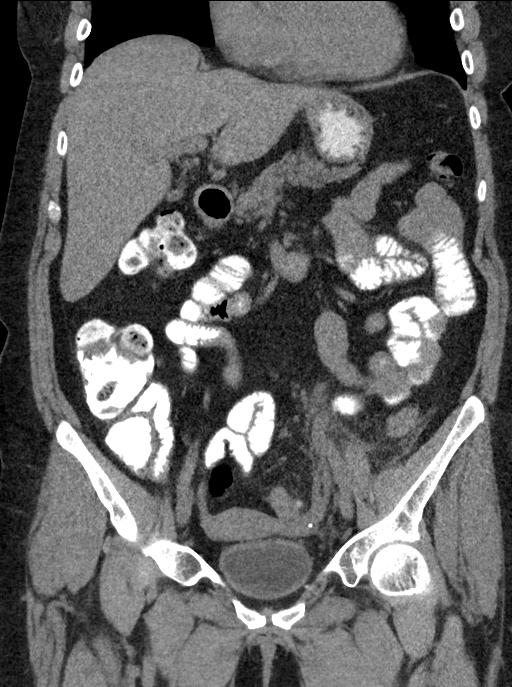
[im 71/128  soft-tissue]
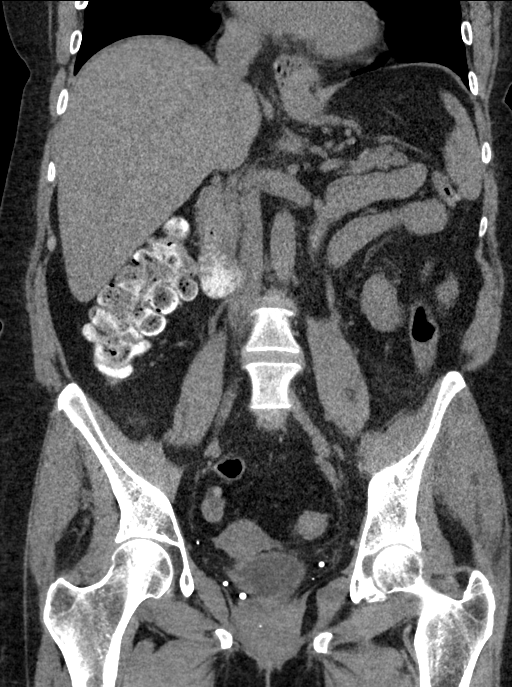

[15 of 46 positions shown; findings below may reference images not displayed]

FINDINGS: Lower chest: Lung bases are clear. Normal heart size. No pericardial
effusion.

Hepatobiliary: No focal liver abnormality is seen. No gallstones,
gallbladder wall thickening, or biliary dilatation.

Pancreas: Unremarkable. No pancreatic ductal dilatation or
surrounding inflammatory changes.

Spleen: Normal in size without focal abnormality.

Adrenals/Urinary Tract: Adrenal glands are unremarkable. Kidneys are
normal, without renal calculi, focal lesion, or hydronephrosis. Mild
asymmetric thickening of the urinary bladder, possibly reactive to
the adjacent inflammatory process of the sigmoid.

Stomach/Bowel: Moderate hiatal hernia. Duodenal sweep is
unremarkable. No bowel wall thickening or dilatation. No evidence of
obstruction. A normal appendix is visualized. Scattered colonic
diverticula with a focal area segmental colonic thickening in the
proximal sigmoid centered upon several of these diverticular
outpouching (coronal series 6, image 45, axial series 3, image 66).
No extraluminal gas is evident. Extensive adjacent phlegmonous
changes present without organized collection or abscess formation.

Vascular/Lymphatic: The aorta is normal caliber. Reactive adenopathy
in the low abdomen. No suspicious or enlarged lymph nodes in the
included lymphatic chains.

Reproductive: Normal anteverted uterus. Left ovary is in close
proximity to the inflamed sigmoid. No concerning adnexal lesions
however

Other: Reactive fluid and phlegmonous change in the left lower
quadrant. No extraluminal gas. No organized collection or abscess
formation. No bowel containing hernias. Small fat containing right
inguinal hernia.

Musculoskeletal: Multilevel degenerative changes are present in the
imaged portions of the spine.
IMPRESSION: 1. Acute diverticulitis of the proximal sigmoid colon. No organized
collection or abscess formation.
2. Mild asymmetric thickening of the urinary bladder wall, possibly
reactive to the adjacent inflammatory process of the sigmoid.
Correlate with urinalysis to exclude cystitis.
3. Moderate hiatal hernia.

## 2021-09-27 DIAGNOSIS — F3175 Bipolar disorder, in partial remission, most recent episode depressed: Secondary | ICD-10-CM | POA: Diagnosis not present

## 2021-10-04 DIAGNOSIS — R87810 Cervical high risk human papillomavirus (HPV) DNA test positive: Secondary | ICD-10-CM | POA: Diagnosis not present

## 2021-10-04 DIAGNOSIS — Z23 Encounter for immunization: Secondary | ICD-10-CM | POA: Diagnosis not present

## 2021-10-13 ENCOUNTER — Telehealth: Payer: BLUE CROSS/BLUE SHIELD | Admitting: Family Medicine

## 2021-11-22 DIAGNOSIS — M720 Palmar fascial fibromatosis [Dupuytren]: Secondary | ICD-10-CM | POA: Diagnosis not present

## 2021-12-09 ENCOUNTER — Ambulatory Visit: Payer: BC Managed Care – PPO | Admitting: Family Medicine

## 2021-12-09 ENCOUNTER — Encounter: Payer: Self-pay | Admitting: Family Medicine

## 2021-12-09 ENCOUNTER — Telehealth: Payer: Self-pay | Admitting: Family Medicine

## 2021-12-09 VITALS — BP 100/68 | HR 73 | Temp 98.1°F | Ht 65.5 in | Wt 159.3 lb

## 2021-12-09 DIAGNOSIS — L989 Disorder of the skin and subcutaneous tissue, unspecified: Secondary | ICD-10-CM

## 2021-12-09 DIAGNOSIS — F3170 Bipolar disorder, currently in remission, most recent episode unspecified: Secondary | ICD-10-CM | POA: Diagnosis not present

## 2021-12-09 DIAGNOSIS — N879 Dysplasia of cervix uteri, unspecified: Secondary | ICD-10-CM | POA: Diagnosis not present

## 2021-12-09 DIAGNOSIS — R8290 Unspecified abnormal findings in urine: Secondary | ICD-10-CM

## 2021-12-09 DIAGNOSIS — Z01818 Encounter for other preprocedural examination: Secondary | ICD-10-CM | POA: Diagnosis not present

## 2021-12-09 DIAGNOSIS — Z13220 Encounter for screening for lipoid disorders: Secondary | ICD-10-CM

## 2021-12-09 DIAGNOSIS — R7301 Impaired fasting glucose: Secondary | ICD-10-CM | POA: Diagnosis not present

## 2021-12-09 LAB — CBC WITH DIFFERENTIAL/PLATELET
Basophils Absolute: 0 10*3/uL (ref 0.0–0.1)
Basophils Relative: 0.6 % (ref 0.0–3.0)
Eosinophils Absolute: 0.1 10*3/uL (ref 0.0–0.7)
Eosinophils Relative: 1.6 % (ref 0.0–5.0)
HCT: 37.1 % (ref 36.0–46.0)
Hemoglobin: 12.4 g/dL (ref 12.0–15.0)
Lymphocytes Relative: 40.3 % (ref 12.0–46.0)
Lymphs Abs: 1.8 10*3/uL (ref 0.7–4.0)
MCHC: 33.3 g/dL (ref 30.0–36.0)
MCV: 91.8 fl (ref 78.0–100.0)
Monocytes Absolute: 0.4 10*3/uL (ref 0.1–1.0)
Monocytes Relative: 9.3 % (ref 3.0–12.0)
Neutro Abs: 2.2 10*3/uL (ref 1.4–7.7)
Neutrophils Relative %: 48.2 % (ref 43.0–77.0)
Platelets: 263 10*3/uL (ref 150.0–400.0)
RBC: 4.05 Mil/uL (ref 3.87–5.11)
RDW: 13 % (ref 11.5–15.5)
WBC: 4.5 10*3/uL (ref 4.0–10.5)

## 2021-12-09 LAB — COMPREHENSIVE METABOLIC PANEL
ALT: 17 U/L (ref 0–35)
AST: 20 U/L (ref 0–37)
Albumin: 4.5 g/dL (ref 3.5–5.2)
Alkaline Phosphatase: 74 U/L (ref 39–117)
BUN: 18 mg/dL (ref 6–23)
CO2: 31 mEq/L (ref 19–32)
Calcium: 9.9 mg/dL (ref 8.4–10.5)
Chloride: 102 mEq/L (ref 96–112)
Creatinine, Ser: 0.82 mg/dL (ref 0.40–1.20)
GFR: 75.81 mL/min (ref >60.00)
Glucose, Bld: 82 mg/dL (ref 70–99)
Potassium: 4.5 mEq/L (ref 3.5–5.1)
Sodium: 139 mEq/L (ref 135–145)
Total Bilirubin: 0.3 mg/dL (ref 0.2–1.2)
Total Protein: 7.5 g/dL (ref 6.0–8.3)

## 2021-12-09 LAB — LIPID PANEL
Cholesterol: 229 mg/dL — ABNORMAL HIGH (ref 0–200)
HDL: 74.8 mg/dL (ref >39.00)
LDL Cholesterol: 127 mg/dL — ABNORMAL HIGH (ref 0–99)
NonHDL: 154.26
Total CHOL/HDL Ratio: 3
Triglycerides: 138 mg/dL (ref 0.0–149.0)
VLDL: 27.6 mg/dL (ref 0.0–40.0)

## 2021-12-09 LAB — PROTIME-INR
INR: 1 ratio (ref 0.8–1.0)
Prothrombin Time: 11 s (ref 9.6–13.1)

## 2021-12-09 MED ORDER — OZEMPIC (0.25 OR 0.5 MG/DOSE) 2 MG/1.5ML ~~LOC~~ SOPN: 0.5000 mg | PEN_INJECTOR | SUBCUTANEOUS | 1 refills | Status: DC

## 2021-12-09 NOTE — Telephone Encounter (Signed)
Patient stopped by desk to ask for the dermatology office that Dr.Koberlein recommended during her appointment. She stated the name had green in it but wasn't sure if it was [facility] dermatology. She did not write the name of the office down and would like a call back so she can contact them     Please advise     Please

## 2021-12-09 NOTE — Progress Notes (Signed)
Summer Craig Date of Birth:  Oct 20, 1956  This patient presents to the office today for a preoperative consultation at the request of surgeon, Dr. Aloha Gell, who plans on performing davinci laparoscopic hysterectomy, bilat salpingo-oophorectomy on March 6.    Planned anesthesia: general  Known anesthesia problems:  none  Bleeding risk:  none Personal or FH of DVT/PE: none    She had foot surgery 1 year ago December. At hospital her sugar was elevated and they wanted to give her insulin which scared her. We discussed and started ozempic, which she feels helps her with not only sugar control, but helps to manage weight with this. Feels she doesn't gain weight as easily when she is on the medication. Sugars have been well controlled; just prediabetic.   Mood/bipolar disorder: doing well overall.  Patient Active Problem List   Diagnosis Date Noted   Posterior tibial tendon dysfunction (PTTD) of left lower extremity 11/09/2020   Bipolar disorder ([place]) 12/12/2019   Hiatal hernia 12/12/2019   Diverticulosis 12/12/2019   Posterior tibial tendonitis of right leg 07/24/2018   Past Surgical History:  Procedure Laterality Date   ARTHRODESIS FOOT WITH WEIL OSTEOTOMY Left 11/09/2020   Procedure: LEFT MEDIAL DISPLACEMENT CALCANEAL OSTEOTOMY, POSTERIOR TIBIAL TENDON DEBRIDEMENT AND TENODESIS, FLEXOR DIGITORUM LONGUS TRANSFER, SPRING LIGAMENT RECONSTRUCTION, POSSIBLE TENDO ACHILLES LENGTHENING;  Surgeon: Erle Crocker, MD;  Location: [facility];  Service: Orthopedics;  Laterality: Left;  LENGTH OF SURGERY: 2.5 HOURS   bunion removal Left    BUNIONECTOMY Right    COLONOSCOPY     COLONOSCOPY W/ POLYPECTOMY     FLEXOR DIGITORUM LONGUS TRANSFER, SPRING LIGAMENT RECONSTRUCTION, POSSIBLE TENDO ACHILLES LENGTHENING (Left   11/09/2020   KNEE ARTHROSCOPY Left    Menisus tear   LEFT MEDIAL DISPLACEMENT CALCANEAL OSTEOTOMY, POSTERIOR TIBIAL TENDON DEBRIDEMENT AND TENODESIS  11/09/2020   TONSILLECTOMY  AND ADENOIDECTOMY      No Known Allergies Current Meds  Medication Sig   fluvoxaMINE (LUVOX) 50 MG tablet Take 1 tablet (50 mg total) by mouth 2 (two) times daily.   folic acid (FOLVITE) 1 MG tablet Take 1 mg by mouth daily.   lamoTRIgine (LAMICTAL) 200 MG tablet Take 200 mg by mouth daily.   metFORMIN (GLUCOPHAGE-XR) 500 MG 24 hr tablet Take 500 mg by mouth 2 (two) times daily.   OLANZapine (ZYPREXA) 2.5 MG tablet Take 2.5 mg by mouth at bedtime.   [DISCONTINUED] Semaglutide,0.25 or 0.5MG /DOS, (OZEMPIC, 0.25 OR 0.5 MG/DOSE,) 2 MG/1.5ML SOPN Inject 0.5 mg into the skin once a week.    Social History   Tobacco Use   Smoking status: Never   Smokeless tobacco: Never  Substance Use Topics   Alcohol use: Yes    Alcohol/week: 2.0 standard drinks    Types: 2 Glasses of wine per week   Family History  Problem Relation Age of Onset   Sleep apnea Mother    Arthritis Mother    Alzheimer's disease Mother    Cancer Father        brain   Alzheimer's disease Maternal Grandmother     Review of Systems  Constitutional:  Negative for chills, fever and malaise/fatigue.  Respiratory:  Negative for cough, shortness of breath and wheezing.   Cardiovascular:  Negative for chest pain and palpitations.  Gastrointestinal:  Negative for abdominal pain, diarrhea and vomiting.  Neurological:  Negative for dizziness and headaches.  Psychiatric/Behavioral:         Mood stable on current medications    Recent Labs: CBC:  Lab Results  Component Value Date   WBC 4.3 11/09/2020   HGB 11.3 (L) 11/09/2020   HCT 34.7 (L) 11/09/2020   MCH 30.5 11/09/2020   MCHC 32.6 11/09/2020   RDW 12.6 11/09/2020   PLT 237 11/09/2020   MPV 10.1 10/25/2020   CMP:  Lab Results  Component Value Date   NA 139 10/25/2020   K 4.3 10/25/2020   CL 102 10/25/2020   CO2 27 10/25/2020   ANIONGAP 10 08/11/2019   GLUCOSE 91 10/25/2020   BUN 17 10/25/2020   CREATININE 0.80 11/09/2020   CREATININE 0.78 10/25/2020    GFRAA >60 08/11/2019   CALCIUM 9.5 10/25/2020   PROT 6.6 10/25/2020   BILITOT 0.6 10/25/2020   ALKPHOS 80 02/27/2020   ALT 17 10/25/2020   AST 16 10/25/2020    HBA1C: No results found for: LABA1C, EAG  Objective:   BP 100/68 (BP Location: Left Arm, Patient Position: Sitting, Cuff Size: Large)    Pulse 73    Temp 98.1 F (36.7 C) (Oral)    Ht 5' 5.5" (1.664 m)    Wt 159 lb 4.8 oz (72.3 kg)    SpO2 96%    BMI 26.11 kg/m  Weight: 159 lb 4.8 oz (72.3 kg)  Physical Exam Constitutional:      General: She is not in acute distress.    Appearance: She is well-developed.  HENT:     Head: Normocephalic and atraumatic.     Right Ear: External ear normal.     Left Ear: External ear normal.     Mouth/Throat:     Pharynx: No oropharyngeal exudate.  Eyes:     Conjunctiva/sclera: Conjunctivae normal.     Pupils: Pupils are equal, round, and reactive to light.  Neck:     Thyroid: No thyromegaly.  Cardiovascular:     Rate and Rhythm: Normal rate and regular rhythm.     Heart sounds: Normal heart sounds. No murmur heard.   No friction rub. No gallop.  Pulmonary:     Effort: Pulmonary effort is normal.     Breath sounds: Normal breath sounds.  Abdominal:     General: Bowel sounds are normal. There is no distension.     Palpations: Abdomen is soft. There is no mass.     Tenderness: There is no abdominal tenderness. There is no guarding.     Hernia: No hernia is present.  Musculoskeletal:        General: No tenderness or deformity. Normal range of motion.     Cervical back: Normal range of motion and neck supple.  Lymphadenopathy:     Cervical: No cervical adenopathy.  Skin:    General: Skin is warm and dry.     Findings: No rash.  Neurological:     Mental Status: She is alert and oriented to person, place, and time.     Deep Tendon Reflexes: Reflexes normal.     Reflex Scores:      Tricep reflexes are 2+ on the right side and 2+ on the left side.      Bicep reflexes are 2+ on the  right side and 2+ on the left side.      Brachioradialis reflexes are 2+ on the right side and 2+ on the left side.      Patellar reflexes are 2+ on the right side and 2+ on the left side. Psychiatric:        Speech: Speech normal.  Behavior: Behavior normal.        Thought Content: Thought content normal.     EKG Interpretation:  normal EKG, normal sinus rhythm.  Labs ordered today.    Assessment:   Summer Craig was seen today for pre-op exam.  Diagnoses and all orders for this visit:  Impaired fasting glucose -     Comprehensive metabolic panel; Future -     Hemoglobin A1c; Future -     Ambulatory referral to Dermatology -     Hemoglobin A1c -     Comprehensive metabolic panel  Bipolar disorder in full remission, most recent episode unspecified type [facility])  Cervical dysplasia  Preoperative evaluation to rule out surgical contraindication -     CBC with Differential/Platelet; Future -     Protime-INR; Future -     Urinalysis with Culture Reflex; Future -     EKG 12-Lead; Future -     EKG 12-Lead -     Urinalysis with Culture Reflex -     Protime-INR -     CBC with Differential/Platelet  Lipid screening -     Lipid panel; Future -     Lipid panel  Skin lesions  Other orders -     Semaglutide,0.25 or 0.5MG /DOS, (OZEMPIC, 0.25 OR 0.5 MG/DOSE,) 2 MG/1.5ML SOPN; Inject 0.5 mg into the skin once a week. -     REFLEXIVE URINE CULTURE   66 y.o.patient  approved for Surgery     Plan:   1. Preoperative workup as follows: will get routine baseline bloodwork.  2. Change in medication regimen before surgery: advised holding metformin 48 hours prior to surgery. 3. No contraindications to planned surgery  Note electronically signed by provider.  Micheline Rough, MD

## 2021-12-10 LAB — URINALYSIS W MICROSCOPIC + REFLEX CULTURE
Bacteria, UA: NONE SEEN /HPF
Bilirubin Urine: NEGATIVE
Glucose, UA: NEGATIVE
Hgb urine dipstick: NEGATIVE
Hyaline Cast: NONE SEEN /LPF
Ketones, ur: NEGATIVE
Leukocyte Esterase: NEGATIVE
Nitrites, Initial: NEGATIVE
Protein, ur: NEGATIVE
RBC / HPF: NONE SEEN /HPF (ref 0–2)
Specific Gravity, Urine: 1.015 (ref 1.001–1.035)
Squamous Epithelial / HPF: NONE SEEN /HPF (ref <=5)
WBC, UA: NONE SEEN /HPF (ref 0–5)
pH: 5.5 (ref 5.0–8.0)

## 2021-12-10 LAB — NO CULTURE INDICATED

## 2021-12-11 ENCOUNTER — Encounter: Payer: Self-pay | Admitting: Family Medicine

## 2021-12-12 LAB — HEMOGLOBIN A1C: Hgb A1c MFr Bld: 5.6 % (ref 4.6–6.5)

## 2021-12-12 NOTE — Telephone Encounter (Signed)
Patient informed via Mychart message

## 2021-12-14 MED ORDER — OZEMPIC (0.25 OR 0.5 MG/DOSE) 2 MG/1.5ML ~~LOC~~ SOPN: 0.5000 mg | PEN_INJECTOR | SUBCUTANEOUS | 1 refills | Status: DC

## 2021-12-29 DIAGNOSIS — N879 Dysplasia of cervix uteri, unspecified: Secondary | ICD-10-CM | POA: Diagnosis not present

## 2021-12-29 DIAGNOSIS — N72 Inflammatory disease of cervix uteri: Secondary | ICD-10-CM | POA: Diagnosis not present

## 2021-12-29 DIAGNOSIS — R87810 Cervical high risk human papillomavirus (HPV) DNA test positive: Secondary | ICD-10-CM | POA: Diagnosis not present

## 2021-12-29 DIAGNOSIS — N859 Noninflammatory disorder of uterus, unspecified: Secondary | ICD-10-CM | POA: Diagnosis not present

## 2021-12-29 DIAGNOSIS — N888 Other specified noninflammatory disorders of cervix uteri: Secondary | ICD-10-CM | POA: Diagnosis not present

## 2022-01-04 ENCOUNTER — Other Ambulatory Visit: Payer: Self-pay | Admitting: Obstetrics

## 2022-01-10 ENCOUNTER — Encounter (HOSPITAL_BASED_OUTPATIENT_CLINIC_OR_DEPARTMENT_OTHER): Payer: Self-pay | Admitting: Obstetrics

## 2022-01-10 ENCOUNTER — Other Ambulatory Visit: Payer: Self-pay

## 2022-01-10 DIAGNOSIS — Z01812 Encounter for preprocedural laboratory examination: Secondary | ICD-10-CM | POA: Diagnosis not present

## 2022-01-10 NOTE — Progress Notes (Signed)
PLEASE WEAR A MASK OUT IN PUBLIC AND SOCIAL DISTANCE AND [place] YOUR HANDS FREQUENTLY. PLEASE ASK ALL YOUR CLOSE HOUSEHOLD CONTACT TO WEAR MASK OUT IN PUBLIC AND SOCIAL DISTANCE AND [place] HANDS FREQUENTLY ALSO.      Your procedure is scheduled on Monday, 01/16/22.  Report to [place] AT  11:30 am.   Call this number if you have problems the morning of surgery  :[phone].   OUR ADDRESS IS [address].  WE ARE LOCATED IN [facility].  PLEASE BRING YOUR INSURANCE CARD AND PHOTO ID DAY OF SURGERY.  ONLY ONE PERSON ALLOWED IN FACILITY WAITING AREA.                                     REMEMBER:  DO NOT EAT FOOD, CANDY GUM OR MINTS  AFTER MIDNIGHT THE NIGHT BEFORE YOUR SURGERY . YOU MAY HAVE CLEAR LIQUIDS FROM MIDNIGHT THE NIGHT BEFORE YOUR SURGERY UNTIL  10:30 am. NO CLEAR LIQUIDS AFTER   10:30 am DAY OF SURGERY.   YOU MAY  BRUSH YOUR TEETH MORNING OF SURGERY AND RINSE YOUR MOUTH OUT, NO CHEWING GUM CANDY OR MINTS.  NO ALCOHOL 24 HOURS BEFORE SURGERY.    CLEAR LIQUID DIET   Foods Allowed                                                                     Foods Excluded  Coffee and tea, regular and decaf                             liquids that you cannot  Plain Jell-O any favor except red or purple                                           see through such as: Fruit ices (not with fruit pulp)                                     milk, soups, orange juice  Iced Popsicles                                    All solid food Carbonated beverages, regular and diet                                    Cranberry, grape and apple juices Sports drinks like Gatorade  Sample Menu Breakfast                                Lunch  Supper Cranberry juice                                           Jell-O                                     Grape juice                           Apple juice Coffee or tea                         Jell-O                                      Popsicle                                                Coffee or tea                        Coffee or tea  _____________________________________________________________________     TAKE THESE MEDICATIONS MORNING OF SURGERY WITH A SIP OF WATER:  If you take any medications the morning of your surgery it is okay to take: Luvox, Lamictal, & Zyprexa Do not take Metformin on the morning of surgery.  ONE VISITOR IS ALLOWED IN WAITING ROOM ONLY DAY OF SURGERY.  YOU MAY HAVE ANOTHER PERSON SWITCH OUT WITH THE  1  VISITOR IN THE WAITING ROOM DAY OF SURGERY AND A MASK MUST BE WORN IN THE WAITING ROOM.    2 VISITORS  MAY VISIT IN THE EXTENDED RECOVERY ROOM UNTIL 800 PM ONLY 1 VISITOR AGE 64 AND OVER MAY SPEND THE NIGHT AND MUST BE IN EXTENDED RECOVERY ROOM NO LATER THAN 800 PM .    UP TO 2 CHILDREN AGE 58 TO 15 MAY ALSO VISIT IN EXTENDED RECOV ERY ROOM ONLY UNTIL 800 PM AND MUST LEAVE BY 800 PM.   ALL PERSONS VISITING IN EXTENDED RECOVERY ROOM MUST WEAR A MASK.                                    DO NOT WEAR JEWERLY, MAKE UP. DO NOT WEAR LOTIONS, POWDERS, PERFUMES OR NAIL POLISH ON YOUR FINGERNAILS. TOENAIL POLISH IS OK TO WEAR. DO NOT SHAVE FOR 48 HOURS PRIOR TO DAY OF SURGERY. MEN MAY SHAVE FACE AND NECK. CONTACTS, GLASSES, OR DENTURES MAY NOT BE WORN TO SURGERY.                                    [facility] IS NOT RESPONSIBLE  FOR ANY BELONGINGS.                                                                    Summer Kitchen  Cra[facility] - Preparing for Surgery Before surgery, you can play an important role.  Because skin is not sterile, your skin needs to be as free of germs as possible.  You can reduce the number of germs on your skin by washing with CHG (chlorahexidine gluconate) soap before surgery.  CHG is an antiseptic cleaner which kills germs and bonds with the skin to continue killing germs even after washing. Please DO NOT use if you have an  allergy to CHG or antibacterial soaps.  If your skin becomes reddened/irritated stop using the CHG and inform your nurse when you arrive at Short Stay. Do not shave (including legs and underarms) for at least 48 hours prior to the first CHG shower.  You may shave your face/neck. Please follow these instructions carefully:  1.  Shower with CHG Soap the night before surgery and the  morning of Surgery.  2.  If you choose to wash your hair, wash your hair first as usual with your  normal  shampoo.  3.  After you shampoo, rinse your hair and body thoroughly to remove the  shampoo.                            4.  Use CHG as you would any other liquid soap.  You can apply chg directly  to the skin and wash                      Gently with a scrungie or clean washcloth.  5.  Apply the CHG Soap to your body ONLY FROM THE NECK DOWN.   Do not use on face/ open                           Wound or open sores. Avoid contact with eyes, ears mouth and genitals (private parts).                       Wash face,  Genitals (private parts) with your normal soap.             6.  Wash thoroughly, paying special attention to the area where your surgery  will be performed.  7.  Thoroughly rinse your body with warm water from the neck down.  8.  DO NOT shower/wash with your normal soap after using and rinsing off  the CHG Soap.                9.  Pat yourself dry with a clean towel.            10.  Wear clean pajamas.            11.  Place clean sheets on your bed the night of your first shower and do not  sleep with pets. Day of Surgery : Do not apply any lotions/deodorants the morning of surgery.  Please wear clean clothes to the hospital/surgery center.  IF YOU HAVE ANY SKIN IRRITATION OR PROBLEMS WITH THE SURGICAL SOAP, PLEASE GET A BAR OF GOLD DIAL SOAP AND SHOWER THE NIGHT BEFORE YOUR SURGERY AND THE MORNING OF YOUR SURGERY. PLEASE LET THE NURSE KNOW MORNING OF YOUR SURGERY IF YOU HAD ANY PROBLEMS WITH THE SURGICAL  SOAP.   ________________________________________________________________________  QUESTIONS CALL Summer Craig PRE OP NURSE PHONE [phone].

## 2022-01-10 NOTE — Progress Notes (Addendum)
Spoke w/ via phone for pre-op interview---[facility] needs dos----none              Lab results------01/12/22 lab appt for CBC, BMP, type & screen, 12/09/21 EKG in chart & Epic COVID test -----patient states asymptomatic no test needed Arrive at -------1130 on Monday, 01/16/22 NPO after MN NO Solid Food.  Clear liquids from MN until---1030 Med rec completed Medications to take morning of surgery -----Luvox, Lamictal, Zyprexa Patient stated that she probably won't take any medications the morning of surgery. Diabetic medication -----Hold Metformin on the morning of surgery. Patient instructed no nail polish to be worn day of surgery Patient instructed to bring photo id and insurance card day of surgery Patient aware to have Driver (ride ) / caregiver    for 24 hours after surgery - daughter Zia Patient Special Instructions -----Extended recovery instructions given. Pre-Op special Istructions -----none Patient verbalized understanding of instructions that were given at this phone interview. Patient denies shortness of breath, chest pain, fever, cough at this phone interview.    Patient saw Dr. Micheline Rough, Family Medicine on 12/09/2021 for preoperative consultation. Per OV note in Epic, there are no contraindications to planned surgery.

## 2022-01-11 ENCOUNTER — Encounter: Payer: Self-pay | Admitting: Family Medicine

## 2022-01-11 DIAGNOSIS — E663 Overweight: Secondary | ICD-10-CM | POA: Insufficient documentation

## 2022-01-11 DIAGNOSIS — R7301 Impaired fasting glucose: Secondary | ICD-10-CM | POA: Insufficient documentation

## 2022-01-12 ENCOUNTER — Encounter (HOSPITAL_COMMUNITY)
Admission: RE | Admit: 2022-01-12 | Discharge: 2022-01-12 | Disposition: A | Discharge status: Home / Self Care | Payer: BC Managed Care – PPO | Source: Ambulatory Visit | Attending: Obstetrics | Admitting: Obstetrics

## 2022-01-12 ENCOUNTER — Other Ambulatory Visit: Payer: Self-pay

## 2022-01-12 DIAGNOSIS — Z01812 Encounter for preprocedural laboratory examination: Secondary | ICD-10-CM | POA: Insufficient documentation

## 2022-01-12 LAB — CBC
HCT: 39 % (ref 36.0–46.0)
Hemoglobin: 12.6 g/dL (ref 12.0–15.0)
MCH: 31 pg (ref 26.0–34.0)
MCHC: 32.3 g/dL (ref 30.0–36.0)
MCV: 95.8 fL (ref 80.0–100.0)
Platelets: 276 10*3/uL (ref 150–400)
RBC: 4.07 MIL/uL (ref 3.87–5.11)
RDW: 13.3 % (ref 11.5–15.5)
WBC: 4.3 10*3/uL (ref 4.0–10.5)
nRBC: 0 % (ref 0.0–0.2)

## 2022-01-12 LAB — BASIC METABOLIC PANEL
Anion gap: 6 (ref 5–15)
BUN: 17 mg/dL (ref 8–23)
CO2: 29 mmol/L (ref 22–32)
Calcium: 9.4 mg/dL (ref 8.9–10.3)
Chloride: 105 mmol/L (ref 98–111)
Creatinine, Ser: 0.69 mg/dL (ref 0.44–1.00)
GFR, Estimated: >60 mL/min (ref >60)
Glucose, Bld: 92 mg/dL (ref 70–99)
Potassium: 4.2 mmol/L (ref 3.5–5.1)
Sodium: 140 mmol/L (ref 135–145)

## 2022-01-16 ENCOUNTER — Ambulatory Visit (HOSPITAL_BASED_OUTPATIENT_CLINIC_OR_DEPARTMENT_OTHER): Payer: BC Managed Care – PPO | Admitting: Anesthesiology

## 2022-01-16 ENCOUNTER — Encounter (HOSPITAL_BASED_OUTPATIENT_CLINIC_OR_DEPARTMENT_OTHER)
Admission: RE | Disposition: A | Discharge status: Home / Self Care | Payer: Self-pay | Source: Ambulatory Visit | Attending: Obstetrics

## 2022-01-16 ENCOUNTER — Ambulatory Visit (HOSPITAL_BASED_OUTPATIENT_CLINIC_OR_DEPARTMENT_OTHER)
Admission: RE | Admit: 2022-01-16 | Discharge: 2022-01-17 | Disposition: A | Discharge status: Home / Self Care | Payer: BC Managed Care – PPO | Source: Ambulatory Visit | Attending: Obstetrics | Admitting: Obstetrics

## 2022-01-16 ENCOUNTER — Other Ambulatory Visit: Payer: Self-pay

## 2022-01-16 ENCOUNTER — Encounter (HOSPITAL_BASED_OUTPATIENT_CLINIC_OR_DEPARTMENT_OTHER): Payer: Self-pay | Admitting: Obstetrics

## 2022-01-16 DIAGNOSIS — F319 Bipolar disorder, unspecified: Secondary | ICD-10-CM | POA: Diagnosis not present

## 2022-01-16 DIAGNOSIS — Z79899 Other long term (current) drug therapy: Secondary | ICD-10-CM | POA: Insufficient documentation

## 2022-01-16 DIAGNOSIS — R87810 Cervical high risk human papillomavirus (HPV) DNA test positive: Secondary | ICD-10-CM | POA: Diagnosis not present

## 2022-01-16 DIAGNOSIS — Z90710 Acquired absence of both cervix and uterus: Secondary | ICD-10-CM | POA: Diagnosis present

## 2022-01-16 DIAGNOSIS — K219 Gastro-esophageal reflux disease without esophagitis: Secondary | ICD-10-CM | POA: Diagnosis not present

## 2022-01-16 DIAGNOSIS — Z86718 Personal history of other venous thrombosis and embolism: Secondary | ICD-10-CM | POA: Diagnosis not present

## 2022-01-16 DIAGNOSIS — N879 Dysplasia of cervix uteri, unspecified: Secondary | ICD-10-CM | POA: Diagnosis not present

## 2022-01-16 DIAGNOSIS — Z7984 Long term (current) use of oral hypoglycemic drugs: Secondary | ICD-10-CM | POA: Diagnosis not present

## 2022-01-16 DIAGNOSIS — C541 Malignant neoplasm of endometrium: Secondary | ICD-10-CM | POA: Diagnosis not present

## 2022-01-16 HISTORY — DX: Herpesviral infection of urogenital system, unspecified: A60.00

## 2022-01-16 HISTORY — DX: Personal history of other diseases of the digestive system: Z87.19

## 2022-01-16 HISTORY — DX: Prediabetes: R73.03

## 2022-01-16 HISTORY — DX: Polyp of colon: K63.5

## 2022-01-16 HISTORY — PX: ROBOTIC ASSISTED TOTAL HYSTERECTOMY WITH BILATERAL SALPINGO OOPHERECTOMY: SHX6086

## 2022-01-16 LAB — TYPE AND SCREEN
ABO/RH(D): A POS
Antibody Screen: NEGATIVE

## 2022-01-16 SURGERY — HYSTERECTOMY, TOTAL, ROBOT-ASSISTED, LAPAROSCOPIC, WITH BILATERAL SALPINGO-OOPHORECTOMY
Anesthesia: General | Laterality: Bilateral

## 2022-01-16 MED ORDER — DEXMEDETOMIDINE (PRECEDEX) IN NS 20 MCG/5ML (4 MCG/ML) IV SYRINGE
PREFILLED_SYRINGE | INTRAVENOUS | Status: DC | PRN
  Administered 2022-01-16 (×2): 10 ug via INTRAVENOUS

## 2022-01-16 MED ORDER — HYDROMORPHONE HCL 1 MG/ML IJ SOLN
0.2000 mg | INTRAMUSCULAR | Status: DC | PRN
  Administered 2022-01-16: 0.5 mg via INTRAVENOUS

## 2022-01-16 MED ORDER — ROPIVACAINE HCL 5 MG/ML IJ SOLN
INTRAMUSCULAR | Status: DC | PRN
  Administered 2022-01-16: 60 mL

## 2022-01-16 MED ORDER — MIDAZOLAM HCL 5 MG/5ML IJ SOLN
INTRAMUSCULAR | Status: DC | PRN
Start: 2022-01-16 — End: 2022-01-16
  Administered 2022-01-16: 2 mg via INTRAVENOUS

## 2022-01-16 MED ORDER — HEMOSTATIC AGENTS (NO CHARGE) OPTIME
TOPICAL | Status: DC | PRN
  Administered 2022-01-16: 1 via TOPICAL

## 2022-01-16 MED ORDER — LIDOCAINE 2% (20 MG/ML) 5 ML SYRINGE
INTRAMUSCULAR | Status: DC | PRN
  Administered 2022-01-16: 100 mg via INTRAVENOUS

## 2022-01-16 MED ORDER — CEFAZOLIN SODIUM-DEXTROSE 2-4 GM/100ML-% IV SOLN
2.0000 g | INTRAVENOUS | Status: AC
  Administered 2022-01-16: 2 g via INTRAVENOUS

## 2022-01-16 MED ORDER — ACETAMINOPHEN 500 MG PO TABS
1000.0000 mg | ORAL_TABLET | Freq: Once | ORAL | Status: DC
Start: 2022-01-16 — End: 2022-01-16

## 2022-01-16 MED ORDER — ONDANSETRON HCL 4 MG/2ML IJ SOLN: 4.0000 mg | Freq: Four times a day (QID) | INTRAMUSCULAR | Status: DC | PRN

## 2022-01-16 MED ORDER — ROCURONIUM BROMIDE 10 MG/ML (PF) SYRINGE
PREFILLED_SYRINGE | INTRAVENOUS | Status: AC
  Filled 2022-01-16: qty 10

## 2022-01-16 MED ORDER — HYDROMORPHONE HCL 1 MG/ML IJ SOLN
INTRAMUSCULAR | Status: AC
  Filled 2022-01-16: qty 1

## 2022-01-16 MED ORDER — ACETAMINOPHEN 500 MG PO TABS
1000.0000 mg | ORAL_TABLET | Freq: Four times a day (QID) | ORAL | Status: DC
  Administered 2022-01-16 – 2022-01-17 (×3): 1000 mg via ORAL

## 2022-01-16 MED ORDER — FENTANYL CITRATE (PF) 100 MCG/2ML IJ SOLN
INTRAMUSCULAR | Status: AC
  Filled 2022-01-16: qty 2

## 2022-01-16 MED ORDER — ACETAMINOPHEN 500 MG PO TABS
ORAL_TABLET | ORAL | Status: AC
Start: 2022-01-16 — End: ?
  Filled 2022-01-16: qty 2

## 2022-01-16 MED ORDER — PROPOFOL 10 MG/ML IV BOLUS
INTRAVENOUS | Status: DC | PRN
  Administered 2022-01-16: 150 mg via INTRAVENOUS

## 2022-01-16 MED ORDER — PANTOPRAZOLE SODIUM 40 MG PO TBEC
40.0000 mg | DELAYED_RELEASE_TABLET | Freq: Every day | ORAL | Status: DC
  Administered 2022-01-16: 40 mg via ORAL

## 2022-01-16 MED ORDER — CEFAZOLIN SODIUM-DEXTROSE 2-4 GM/100ML-% IV SOLN
INTRAVENOUS | Status: AC
  Filled 2022-01-16: qty 100

## 2022-01-16 MED ORDER — LAMOTRIGINE 100 MG PO TABS
200.0000 mg | ORAL_TABLET | Freq: Every day | ORAL | Status: DC
  Administered 2022-01-16: 200 mg via ORAL
  Filled 2022-01-16: qty 2

## 2022-01-16 MED ORDER — POVIDONE-IODINE 10 % EX SWAB: 2.0000 "application " | Freq: Once | CUTANEOUS | Status: DC

## 2022-01-16 MED ORDER — SUGAMMADEX SODIUM 200 MG/2ML IV SOLN
INTRAVENOUS | Status: DC | PRN
  Administered 2022-01-16: 150 mg via INTRAVENOUS

## 2022-01-16 MED ORDER — SODIUM CHLORIDE 0.9 % IR SOLN
Status: DC | PRN
  Administered 2022-01-16: 1000 mL
  Administered 2022-01-16: 1000 mL via INTRAVESICAL

## 2022-01-16 MED ORDER — SIMETHICONE 80 MG PO CHEW: 80.0000 mg | CHEWABLE_TABLET | Freq: Four times a day (QID) | ORAL | Status: DC | PRN

## 2022-01-16 MED ORDER — MENTHOL 3 MG MT LOZG: 1.0000 | LOZENGE | OROMUCOSAL | Status: DC | PRN

## 2022-01-16 MED ORDER — FENTANYL CITRATE (PF) 250 MCG/5ML IJ SOLN
INTRAMUSCULAR | Status: AC
  Filled 2022-01-16: qty 5

## 2022-01-16 MED ORDER — AMISULPRIDE (ANTIEMETIC) 5 MG/2ML IV SOLN: 10.0000 mg | Freq: Once | INTRAVENOUS | Status: DC | PRN

## 2022-01-16 MED ORDER — IBUPROFEN 200 MG PO TABS: 600.0000 mg | ORAL_TABLET | Freq: Four times a day (QID) | ORAL | Status: DC

## 2022-01-16 MED ORDER — FLUVOXAMINE MALEATE 50 MG PO TABS
50.0000 mg | ORAL_TABLET | Freq: Two times a day (BID) | ORAL | Status: DC
  Administered 2022-01-16: 50 mg via ORAL
  Filled 2022-01-16: qty 1

## 2022-01-16 MED ORDER — METHYLENE BLUE 0.5 % INJ SOLN
INTRAVENOUS | Status: DC | PRN
Start: 2022-01-16 — End: 2022-01-16
  Administered 2022-01-16: 10 mL

## 2022-01-16 MED ORDER — ONDANSETRON HCL 4 MG PO TABS: 4.0000 mg | ORAL_TABLET | Freq: Four times a day (QID) | ORAL | Status: DC | PRN

## 2022-01-16 MED ORDER — DEXAMETHASONE SODIUM PHOSPHATE 10 MG/ML IJ SOLN
INTRAMUSCULAR | Status: AC
  Filled 2022-01-16: qty 1

## 2022-01-16 MED ORDER — OXYCODONE HCL 5 MG PO TABS: 5.0000 mg | ORAL_TABLET | Freq: Once | ORAL | Status: DC | PRN

## 2022-01-16 MED ORDER — ROCURONIUM BROMIDE 10 MG/ML (PF) SYRINGE
PREFILLED_SYRINGE | INTRAVENOUS | Status: DC | PRN
Start: 2022-01-16 — End: 2022-01-16
  Administered 2022-01-16 (×3): 20 mg via INTRAVENOUS
  Administered 2022-01-16: 60 mg via INTRAVENOUS
  Administered 2022-01-16: 20 mg via INTRAVENOUS

## 2022-01-16 MED ORDER — LACTATED RINGERS IV SOLN: INTRAVENOUS | Status: DC

## 2022-01-16 MED ORDER — FENTANYL CITRATE (PF) 100 MCG/2ML IJ SOLN
25.0000 ug | INTRAMUSCULAR | Status: DC | PRN
  Administered 2022-01-16: 50 ug via INTRAVENOUS
  Administered 2022-01-16 (×2): 25 ug via INTRAVENOUS
  Administered 2022-01-16: 50 ug via INTRAVENOUS

## 2022-01-16 MED ORDER — OXYCODONE HCL 5 MG/5ML PO SOLN: 5.0000 mg | Freq: Once | ORAL | Status: DC | PRN

## 2022-01-16 MED ORDER — DEXAMETHASONE SODIUM PHOSPHATE 10 MG/ML IJ SOLN
INTRAMUSCULAR | Status: DC | PRN
  Administered 2022-01-16: 5 mg via INTRAVENOUS

## 2022-01-16 MED ORDER — KETOROLAC TROMETHAMINE 30 MG/ML IJ SOLN: 30.0000 mg | Freq: Four times a day (QID) | INTRAMUSCULAR | Status: DC

## 2022-01-16 MED ORDER — MIDAZOLAM HCL 2 MG/2ML IJ SOLN
INTRAMUSCULAR | Status: AC
  Filled 2022-01-16: qty 2

## 2022-01-16 MED ORDER — DEXMEDETOMIDINE (PRECEDEX) IN NS 20 MCG/5ML (4 MCG/ML) IV SYRINGE
PREFILLED_SYRINGE | INTRAVENOUS | Status: AC
Start: 2022-01-16 — End: ?
  Filled 2022-01-16: qty 5

## 2022-01-16 MED ORDER — ONDANSETRON HCL 4 MG/2ML IJ SOLN
INTRAMUSCULAR | Status: DC | PRN
  Administered 2022-01-16: 4 mg via INTRAVENOUS

## 2022-01-16 MED ORDER — KETOROLAC TROMETHAMINE 30 MG/ML IJ SOLN
INTRAMUSCULAR | Status: DC | PRN
Start: 2022-01-16 — End: 2022-01-16
  Administered 2022-01-16: 30 mg via INTRAVENOUS

## 2022-01-16 MED ORDER — BUPIVACAINE HCL (PF) 0.25 % IJ SOLN
INTRAMUSCULAR | Status: DC | PRN
Start: 2022-01-16 — End: 2022-01-16
  Administered 2022-01-16: 15 mL

## 2022-01-16 MED ORDER — EPHEDRINE SULFATE (PRESSORS) 50 MG/ML IJ SOLN
INTRAMUSCULAR | Status: DC | PRN
  Administered 2022-01-16 (×2): 10 mg via INTRAVENOUS

## 2022-01-16 MED ORDER — ONDANSETRON HCL 4 MG/2ML IJ SOLN
INTRAMUSCULAR | Status: AC
Start: 2022-01-16 — End: ?
  Filled 2022-01-16: qty 2

## 2022-01-16 MED ORDER — KETOROLAC TROMETHAMINE 30 MG/ML IJ SOLN
30.0000 mg | Freq: Four times a day (QID) | INTRAMUSCULAR | Status: DC
  Administered 2022-01-17 (×2): 30 mg via INTRAVENOUS

## 2022-01-16 MED ORDER — OLANZAPINE 2.5 MG PO TABS
2.5000 mg | ORAL_TABLET | Freq: Every day | ORAL | Status: DC
  Administered 2022-01-16: 2.5 mg via ORAL
  Filled 2022-01-16: qty 1

## 2022-01-16 MED ORDER — PANTOPRAZOLE SODIUM 40 MG PO TBEC
DELAYED_RELEASE_TABLET | ORAL | Status: AC
  Filled 2022-01-16: qty 1

## 2022-01-16 MED ORDER — FENTANYL CITRATE (PF) 100 MCG/2ML IJ SOLN
INTRAMUSCULAR | Status: DC | PRN
  Administered 2022-01-16: 100 ug via INTRAVENOUS
  Administered 2022-01-16: 50 ug via INTRAVENOUS
  Administered 2022-01-16: 25 ug via INTRAVENOUS
  Administered 2022-01-16: 50 ug via INTRAVENOUS

## 2022-01-16 MED ORDER — KETOROLAC TROMETHAMINE 30 MG/ML IJ SOLN
INTRAMUSCULAR | Status: AC
  Filled 2022-01-16: qty 1

## 2022-01-16 SURGICAL SUPPLY — 68 items
ADH SKN CLS APL DERMABOND .7 (GAUZE/BANDAGES/DRESSINGS) ×1
APL PRP STRL LF DISP 70% ISPRP (MISCELLANEOUS) ×1
APL SRG 38 LTWT LNG FL B (MISCELLANEOUS) ×1
APPLICATOR ARISTA FLEXITIP XL (MISCELLANEOUS) ×1 IMPLANT
BARRIER ADHS 3X4 INTERCEED (GAUZE/BANDAGES/DRESSINGS) IMPLANT
BRR ADH 4X3 ABS CNTRL BYND (GAUZE/BANDAGES/DRESSINGS)
CHLORAPREP W/TINT 26 (MISCELLANEOUS) ×2 IMPLANT
CNTNR URN SCR LID CUP LEK RST (MISCELLANEOUS) ×1 IMPLANT
CONT SPEC 4OZ STRL OR WHT (MISCELLANEOUS) ×2
COVER BACK TABLE 60X90IN (DRAPES) ×2 IMPLANT
COVER TIP SHEARS 8 DVNC (MISCELLANEOUS) ×1 IMPLANT
COVER TIP SHEARS 8MM DA VINCI (MISCELLANEOUS) ×1
DECANTER SPIKE VIAL GLASS SM (MISCELLANEOUS) ×4 IMPLANT
DEFOGGER SCOPE WARMER CLEARIFY (MISCELLANEOUS) ×2 IMPLANT
DERMABOND ADVANCED (GAUZE/BANDAGES/DRESSINGS) ×1
DERMABOND ADVANCED .7 DNX12 (GAUZE/BANDAGES/DRESSINGS) ×1 IMPLANT
DRAPE ARM DVNC X/XI (DISPOSABLE) ×4 IMPLANT
DRAPE COLUMN DVNC XI (DISPOSABLE) ×1 IMPLANT
DRAPE DA VINCI XI ARM (DISPOSABLE) ×4
DRAPE DA VINCI XI COLUMN (DISPOSABLE) ×1
DRAPE UTILITY XL STRL (DRAPES) ×2 IMPLANT
ELECT REM PT RETURN 9FT ADLT (ELECTROSURGICAL) ×2
ELECTRODE REM PT RTRN 9FT ADLT (ELECTROSURGICAL) ×1 IMPLANT
GAUZE 4X4 16PLY ~~LOC~~+RFID DBL (SPONGE) ×4 IMPLANT
GLOVE SURG ENC MOIS LTX SZ6.5 (GLOVE) ×6 IMPLANT
GLOVE SURG UNDER POLY LF SZ7 (GLOVE) ×10 IMPLANT
HEMOSTAT ARISTA ABSORB 3G PWDR (HEMOSTASIS) ×1 IMPLANT
HOLDER FOLEY CATH W/STRAP (MISCELLANEOUS) IMPLANT
IRRIG SUCT STRYKERFLOW 2 WTIP (MISCELLANEOUS) ×4
IRRIGATION SUCT STRKRFLW 2 WTP (MISCELLANEOUS) ×1 IMPLANT
IV NS 1000ML (IV SOLUTION) ×2
IV NS 1000ML BAXH (IV SOLUTION) ×1 IMPLANT
KIT TURNOVER CYSTO (KITS) ×2 IMPLANT
LEGGING LITHOTOMY PAIR STRL (DRAPES) ×3 IMPLANT
NEEDLE INSUFFLATION 120MM (ENDOMECHANICALS) ×1 IMPLANT
OBTURATOR OPTICAL STANDARD 8MM (TROCAR) ×1
OBTURATOR OPTICAL STND 8 DVNC (TROCAR) ×1
OBTURATOR OPTICALSTD 8 DVNC (TROCAR) ×1 IMPLANT
OCCLUDER COLPOPNEUMO (BALLOONS) ×2 IMPLANT
PACK ROBOT WH (CUSTOM PROCEDURE TRAY) ×2 IMPLANT
PACK ROBOTIC GOWN (GOWN DISPOSABLE) ×2 IMPLANT
PACK TRENDGUARD 450 HYBRID PRO (MISCELLANEOUS) IMPLANT
PAD OB MATERNITY 4.3X12.25 (PERSONAL CARE ITEMS) ×2 IMPLANT
PAD PREP 24X48 CUFFED NSTRL (MISCELLANEOUS) ×2 IMPLANT
PROTECTOR NERVE ULNAR (MISCELLANEOUS) ×4 IMPLANT
SEAL CANN UNIV 5-8 DVNC XI (MISCELLANEOUS) ×4 IMPLANT
SEAL XI 5MM-8MM UNIVERSAL (MISCELLANEOUS) ×4
SEALER VESSEL DA VINCI XI (MISCELLANEOUS) ×1
SEALER VESSEL EXT DVNC XI (MISCELLANEOUS) ×1 IMPLANT
SET TRI-LUMEN FLTR TB AIRSEAL (TUBING) ×2 IMPLANT
SPONGE T-LAP 4X18 ~~LOC~~+RFID (SPONGE) ×2 IMPLANT
SUT VIC AB 0 CT1 27 (SUTURE) ×4
SUT VIC AB 0 CT1 27XBRD ANBCTR (SUTURE) ×2 IMPLANT
SUT VIC AB 0 CT1 36 (SUTURE) ×2 IMPLANT
SUT VIC AB 4-0 PS2 27 (SUTURE) ×4 IMPLANT
SUT VICRYL 0 UR6 27IN ABS (SUTURE) ×2 IMPLANT
SUT VLOC 180 0 9IN  GS21 (SUTURE) ×1
SUT VLOC 180 0 9IN GS21 (SUTURE) ×1 IMPLANT
SYSTEM CARTER THOMASON II (TROCAR) IMPLANT
TIP RUMI ORANGE 6.7MMX12CM (TIP) IMPLANT
TIP UTERINE 5.1X6CM LAV DISP (MISCELLANEOUS) ×2 IMPLANT
TIP UTERINE 6.7X10CM GRN DISP (MISCELLANEOUS) IMPLANT
TIP UTERINE 6.7X6CM WHT DISP (MISCELLANEOUS) IMPLANT
TIP UTERINE 6.7X8CM BLUE DISP (MISCELLANEOUS) IMPLANT
TOWEL OR 17X26 10 PK STRL BLUE (TOWEL DISPOSABLE) ×2 IMPLANT
TRAY FOLEY W/BAG SLVR 14FR LF (SET/KITS/TRAYS/PACK) ×2 IMPLANT
TRENDGUARD 450 HYBRID PRO PACK (MISCELLANEOUS)
TROCAR PORT AIRSEAL 5X120 (TROCAR) ×2 IMPLANT

## 2022-01-16 NOTE — Anesthesia Preprocedure Evaluation (Signed)
Anesthesia Evaluation  ?Patient identified by MRN, date of birth, ID band ?Patient awake ? ? ? ?Reviewed: ?Allergy & Precautions, NPO status , Patient's Chart, lab work & pertinent test results ? ?History of Anesthesia Complications ?Negative for: history of anesthetic complications ? ?Airway ?Mallampati: II ? ?TM Distance: >3 FB ?Neck ROM: Full ? ? ? Dental ? ?(+) Dental Advisory Given, Teeth Intact ?  ?Pulmonary ?neg pulmonary ROS,  ?  ?breath sounds clear to auscultation ? ? ? ? ? ? Cardiovascular ?negative cardio ROS ? ? ?Rhythm:Regular  ? ?  ?Neuro/Psych ?PSYCHIATRIC DISORDERS Depression negative neurological ROS ?   ? GI/Hepatic ?Neg liver ROS, hiatal hernia, GERD  ,  ?Endo/Other  ?Oral Hypoglycemic AgentsLab Results ?     Component                Value               Date                 ?     HGBA1C                   5.6                 12/09/2021           ? ? Renal/GU ?negative Renal ROS  ? ?  ?Musculoskeletal ? ?(+) Arthritis ,  ? Abdominal ?  ?Peds ? Hematology ?negative hematology ROS ?(+) Lab Results ?     Component                Value               Date                 ?     WBC                      4.3                 01/12/2022           ?     HGB                      12.6                01/12/2022           ?     HCT                      39.0                01/12/2022           ?     MCV                      95.8                01/12/2022           ?     PLT                      276                 01/12/2022           ?   ?Anesthesia Other Findings ? ? Reproductive/Obstetrics ? ?  ? ? ? ? ? ? ? ? ? ? ? ? ? ?  ?  ? ? ? ? ? ? ? ? ?  Anesthesia Physical ?Anesthesia Plan ? ?ASA: 2 ? ?Anesthesia Plan: General  ? ?Post-op Pain Management: Ofirmev IV (intra-op)* and Toradol IV (intra-op)*  ? ?Induction: Intravenous ? ?PONV Risk Score and Plan: 3 and Ondansetron and Dexamethasone ? ?Airway Management Planned: Oral ETT ? ?Additional Equipment: None ? ?Intra-op Plan:   ? ?Post-operative Plan: Extubation in OR ? ?Informed Consent: I have reviewed the patients History and Physical, chart, labs and discussed the procedure including the risks, benefits and alternatives for the proposed anesthesia with the patient or authorized representative who has indicated his/her understanding and acceptance.  ? ? ? ?Dental advisory given ? ?Plan Discussed with: CRNA and Anesthesiologist ? ?Anesthesia Plan Comments:   ? ? ? ? ? ? ?Anesthesia Quick Evaluation ? ?

## 2022-01-16 NOTE — Brief Op Note (Signed)
01/16/2022 ? ?5:54 PM ? ?PATIENT:  Summer Craig  66 y.o. female ? ?PRE-OPERATIVE DIAGNOSIS:  Persistent Cervical Dysplasia ? ?POST-OPERATIVE DIAGNOSIS:  Persistent Cervical Dysplasia ? ?PROCEDURE:  Procedure(s) with comments: ?XI ROBOTIC ASSISTED TOTAL HYSTERECTOMY WITH BILATERAL SALPINGO OOPHORECTOMY (Bilateral) - Requests 3hrs. ? ?SURGEON:  Surgeon(s) and Role: ?   Aloha Gell, MD - Primary ?   * Law, Cassandra A, DO - Assisting ? ?PHYSICIAN ASSISTANT: None ? ?ASSISTANTS: As above ? ?ANESTHESIA:   local and general ? ?EBL:  100 mL  ? ?BLOOD ADMINISTERED:none ? ?DRAINS: Urinary Catheter (Foley)  ? ?LOCAL MEDICATIONS USED:  MARCAINE    ? ?SPECIMEN: Uterus cervix bilateral ovaries and fallopian tubes ? ?DISPOSITION OF SPECIMEN:  PATHOLOGY ? ?COUNTS:  YES ? ?TOURNIQUET:  * No tourniquets in log * ? ?DICTATION: .Note written in EPIC ? ?PLAN OF CARE: Admit for overnight observation ? ?PATIENT DISPOSITION:  PACU - hemodynamically stable. ?  ?Delay start of Pharmacological VTE agent (>24hrs) due to surgical blood loss or risk of bleeding: yes ? ?

## 2022-01-16 NOTE — H&P (Addendum)
Chief complaint: Recurrent cervical dysplasia ? ?History of present illness: 66 year old G2 P2 with 2 prior spontaneous vaginal deliveries, history of diverticulitis and recurrent cervical dysplasia here for definitive laparoscopic hysterectomy.  Given her age planning bilateral salpingo-oophorectomy as well.  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  Patient needed LEEP procedure once in her 25s and again in her 81s.  Despite this she continues to have HPV on Pap smears.  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 ?Ultrasound: Uterus 6 x 3.3 x 3.2 cm no free fluid unable to see the right ovary well, normal left ovary, trace free fluid in the endometrium with anterior 1.3 mm and posterior wall 1.1 mm.  There was a hyperechoic focal area extending from the endometrium into the myometrium at 1.4 x 1.3 x 0.6 cm.  Endometrial biopsy was benign endocervical tissue with no endometrial tissue suggesting that this lesion is scar. ? ?.  Patient is not currently sexually active. ? ?Pap history January 19 normal Pap, HPV negative ?February 2021 normal Pap and HPV positive stenotic cervix and ECC with L SIL ?April 2022 normal Pap high risk HPV positive, 18/45 positive colpo inadequate with benign ECC ? ?Patient with all GYN parts menopause in her early 95s she never did hormones.  No significant hot flashes.  Not sexually active.  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 ?With bipolar disease stable on medications.  On metformin and Ozempic for weight control.  Patient has had medical clearance from her primary care doctor ? ?Medications: Ozempic, Lamictal, metformin, folic acid ? ?No family history of breast ovarian or colon cancer ? ?Past Medical History:  ?Diagnosis Date  ? Arthritis   ? both knees  ? Bipolar disorder ([place])   ? Colon polyp   ? Depression   ? Diverticulitis 2021  ? DVT (deep venous thrombosis) ([place])   ? Left leg- post surgery- per  patient. More than 10 years ago per pt on 01/10/22.  ? Dysplasia of cervix, low grade (CIN 1) 12/14/2011  ? LEEP procedures x 2  ? Genital warts   ? GERD (gastroesophageal reflux disease)   ? nexiumas needed  ? Herpes genitalia   ? History of hiatal hernia   ? Pre-diabetes   ? Patient takes Metformin and Ozempic to help with blood sugar and weight loss.  ? Urine incontinence   ? ?Past Surgical History:  ?Procedure Laterality Date  ? ARTHRODESIS FOOT WITH WEIL OSTEOTOMY Left 11/09/2020  ? Procedure: LEFT MEDIAL DISPLACEMENT CALCANEAL OSTEOTOMY, POSTERIOR TIBIAL TENDON DEBRIDEMENT AND TENODESIS, FLEXOR DIGITORUM LONGUS TRANSFER, SPRING LIGAMENT RECONSTRUCTION, POSSIBLE TENDO ACHILLES LENGTHENING;  Surgeon: Erle Crocker, MD;  Location: [facility];  Service: Orthopedics;  Laterality: Left;  LENGTH OF SURGERY: 2.5 HOURS  ? bunion removal Left   ? BUNIONECTOMY Right   ? COLONOSCOPY  12/02/2019  ? COLONOSCOPY W/ POLYPECTOMY    ? FLEXOR DIGITORUM LONGUS TRANSFER, SPRING LIGAMENT RECONSTRUCTION, POSSIBLE TENDO ACHILLES LENGTHENING (Left   11/09/2020  ? KNEE ARTHROSCOPY Left 2022  ? Menisus tear  ? LEFT MEDIAL DISPLACEMENT CALCANEAL OSTEOTOMY, POSTERIOR TIBIAL TENDON DEBRIDEMENT AND TENODESIS  11/09/2020  ? TONSILLECTOMY AND ADENOIDECTOMY    ? 66 years old  ?  ?CBC ?   ?Component Value Date/Time  ? WBC 4.3 01/12/2022 0956  ? RBC 4.07 01/12/2022 0956  ? HGB 12.6 01/12/2022 0956  ? HCT 39.0 01/12/2022 0956  ? PLT 276 01/12/2022 0956  ?  MCV 95.8 01/12/2022 0956  ? MCH 31.0 01/12/2022 0956  ? MCHC 32.3 01/12/2022 0956  ? RDW 13.3 01/12/2022 0956  ? LYMPHSABS 1.8 12/09/2021 1430  ? MONOABS 0.4 12/09/2021 1430  ? EOSABS 0.1 12/09/2021 1430  ? BASOSABS 0.0 12/09/2021 1430  ?  ?Assessment plan: 66 year old G2 P2 menopausal patient with persistent cervical dysplasia for definitive robotic assisted laparoscopic hysterectomy with bilateral salpingo-oophorectomy.  Risk benefits reviewed with patient and she agrees.  Patient consents to  blood if needed ? ?Bipolar disease.  Continue meds postpartum ? ?History of DVT.  Plan postop prophylactic Lovenox. ? ?Ala Dach ?01/16/2022 1:52 PM  ? ?PE Addendum, PE at time of admission, just prior to surgery ?Vitals as per chart ?Gen: well appearing, slightly anxious ?Skin: warm, dry ?Abd: soft, NT ?GU: deferred to OR ?LE: NT, no edema ? ?Ala Dach ?01/23/2022 8:12 PM  ? ?

## 2022-01-16 NOTE — Progress Notes (Signed)
Patient ambulated from stretcher to the bed. ?

## 2022-01-16 NOTE — Anesthesia Postprocedure Evaluation (Signed)
Anesthesia Post Note ? ?Patient: Summer Craig ? ?Procedure(s) Performed: XI ROBOTIC ASSISTED TOTAL HYSTERECTOMY WITH BILATERAL SALPINGO OOPHORECTOMY (Bilateral) ? ?  ? ?Patient location during evaluation: PACU ?Anesthesia Type: General ?Level of consciousness: awake ?Pain management: pain level controlled ?Vital Signs Assessment: post-procedure vital signs reviewed and stable ?Respiratory status: spontaneous breathing ?Cardiovascular status: stable ?Postop Assessment: no apparent nausea or vomiting ?Anesthetic complications: no ? ? ?No notable events documented. ? ?Last Vitals:  ?Vitals:  ? 01/16/22 1902 01/16/22 1930  ?BP: (!) 143/69 (!) 152/88  ?Pulse: 77 72  ?Resp: 16 16  ?Temp: 36.6 ?C 36.4 ?C  ?SpO2: 100% 100%  ?  ?Last Pain:  ?Vitals:  ? 01/16/22 1930  ?TempSrc:   ?PainSc: 4   ? ? ?  ?  ?  ?  ?  ?  ? ?Tytionna Cloyd ? ? ? ? ?

## 2022-01-16 NOTE — Anesthesia Procedure Notes (Signed)
Procedure Name: Intubation ?Date/Time: 01/16/2022 2:06 PM ?Performed by: Bonney Aid, CRNA ?Pre-anesthesia Checklist: Patient identified, Emergency Drugs available, Suction available and Patient being monitored ?Patient Re-evaluated:Patient Re-evaluated prior to induction ?Oxygen Delivery Method: Circle system utilized ?Preoxygenation: Pre-oxygenation with 100% oxygen ?Induction Type: IV induction ?Ventilation: Mask ventilation without difficulty ?Laryngoscope Size: Mac and 3 ?Grade View: Grade II ?Tube type: Oral ?Tube size: 7.5 mm ?Number of attempts: 2 ?Airway Equipment and Method: Stylet ?Placement Confirmation: ETT inserted through vocal cords under direct vision, positive ETCO2 and breath sounds checked- equal and bilateral ?Secured at: 21 cm ?Tube secured with: Tape ?Dental Injury: Teeth and Oropharynx as per pre-operative assessment  ? ? ? ? ?

## 2022-01-16 NOTE — Op Note (Signed)
01/16/2022  5:54 PM  PATIENT:  Summer Craig  66 y.o. female  PRE-OPERATIVE DIAGNOSIS:  Persistent Cervical Dysplasia  POST-OPERATIVE DIAGNOSIS:  Persistent Cervical Dysplasia  PROCEDURE:  Procedure(s) with comments: XI ROBOTIC ASSISTED TOTAL HYSTERECTOMY WITH BILATERAL SALPINGO OOPHORECTOMY (Bilateral) - Requests 3hrs.  SURGEON:  Surgeon(s) and Role:    * Aloha Gell, MD - Primary    * Law, Cassandra A, DO - Assisting  PHYSICIAN ASSISTANT: None  ASSISTANTS: As above  ANESTHESIA:   local and general  EBL:  100 mL   BLOOD ADMINISTERED:none  DRAINS: Urinary Catheter (Foley)   LOCAL MEDICATIONS USED:  MARCAINE     SPECIMEN: Uterus cervix bilateral ovaries and fallopian tubes  DISPOSITION OF SPECIMEN:  PATHOLOGY  COUNTS:  YES  TOURNIQUET:  * No tourniquets in log *  DICTATION: .Note written in EPIC  PLAN OF CARE: Admit for overnight observation  PATIENT DISPOSITION:  PACU - hemodynamically stable.   Delay start of Pharmacological VTE agent (>24hrs) due to surgical blood loss or risk of bleeding: yes   Procedure:Robotic-assisted total laparoscopic hysterectomy with BSO Complications: none Antibiotics: 2 g Ancef Findings: nl b/l ovaries and tubes, normal uterus, Hemostasis post-procedure with peristalsis of bilateral ureters.   Indications: This is a 66 year old patient here for robotic assisted total laparoscopic hysterectomy with bilateral salpingo-oophorectomy for persistent cervical dysplasia.  Procedure: After informed consent obtained, the patient was taken to the operating room where general anesthesia was initiated without difficulty.   She was prepped and draped in normal sterile fashion in the dorsal supine lithotomy position. A Foley catheter was inserted sterilely into the bladder.  Of note after the vaginal prep several superficial mucosal tears were noted of the vagina.  Some bleeding was noted. A bimanual examination was done to assess the size  and position of the uterus. A weighted speculum was placed in the vagina and deaver retractors were used on the anterior vaginal wall. .The cervix was grasped with tenaculum.  As expected the cervix was flush with the vaginal wall but with tension on anterior tenaculum cyst patient cervix was noted to allow for placement of a Rumi Koh ring.  Anterior and posterior stay sutures were placed on the cervical lips.  The uterus sounded to 6 cm. A small cup and an 6 cm shaft was used. The uterine balloon was inflated.  Gloved were changed. Attention was then turned to the patient's abdomen. 0.5 % marcaine was used prior to all incision. A total of 10 cc of marcaine was used.  A 8 mm incision was made in the umbilicus and a varies needle was placed into the peritoneal cavity.  Intraperitoneal placement was confirmed with use of a saline filled syringe and pneumoperitoneum was created to 15 mmHg.  A nonbladed 8 mm trocar was then placed into the pelvic cavity.  Proper placement was confirmed with the use of a laparoscope.  Of note significant adhesions were noted just above the umbilicus and in the upper right abdomen.  With appropriate manipulation the liver edge could be seen and was felt to be normal.  Tubes and ovaries were evaluated and felt to be normal.  A enlarged bladder was noted.  During evaluation of the anatomy and with trying to move the full bowels from the lower pelvis into the upper pelvis the atraumatic robotic instruments.wrapped in the omentum.  No intestine was involved.  A small amount of bleeding was noted from the omentum.  This was evaluated through the case and bleeding  stopped  The abdominal wall was assessed and additional port sites were marked. 8 mm incisions were placed in the right (two) and left lower quadrants (2) and non-bladed ports were placed under direct visualization. The robot was brought to the patient's side and attached with the right side docking. The robotic instruments were  placed under direct visualization until proper placement just over the uterus.  I then went to the robotic console. Brief survey of the patient's abdomen and pelvis revealed  findings as above.Bilateral tubes and ovaries were normal. No significant adhesions were noted in the lower pelvis however the bladder was significantly enlarged and several filmy adhesions were noted between the bladder and lower uterus..   I began on the left side: the  Left ureter and  IP were identified. The IP was serially cauterized and transected with the vessel sealer. The left round ligament was cauterized and divided.  The anterior leaf of the broad ligament was dissected bluntly then monopolar cautery was used to separate the anterior and posterior leafs of the broad ligament.  I began to create a bladder flap but  the filmy adhesions between the enlarged bladder and the uterus, as well as the limited mobility of the uterus made this dissection challenging.  Only a minimal bladder flap was created.  Most of the positioning of the uterus were done with robotic and assistant ports.    Attention was then turned to the patient's  Right side: the right IP was serially cauterized and transected.  The right round  ligament was cauterized and transected. The anterior and posterior leaves of the broad ligament were bluntly and sharply dissected apart from each other. Monopolar cautery was used to come across the anterior leaf of the right broad ligament.  I then attempted to create a bladder flap coming from the right side towards the midline but more fatty tissue than expected was encountered.  The bladder seems clear of the dissection but the lack of uterine manipulation impacted the normal views.  With further exploration I encountered the stem of the uterine manipulator in the paravaginal tissue.    I then went back down to the patient's vagina and removed the uterine manipulator.  Using the stay sutures on the cervix and placing  a dilator through the cervix into the fundus I was able to manipulate the uterus properly.  I then placed the uterine manipulator back into the uterus.  The cervix was seated in the small Koh ring and the stay sutures were used for additional countertraction.  I did note a small tear in the right upper vagina.  A small amount of bleeding was noted and the plan was to reevaluate at the end of the case.     I then went back to the robotic console and now with improved manipulation of the uterus was able to create a bladder flap.  Of note the posterior edge of the bladder had been denuded by the prior dissection.  A usual bladder flap was created at this time.  With placing the uterus on stretch into the right,  the leftt uterine artery was serially cauterized with the vessel sealer and transected.  The uterus was manipulated to the other side and the right uterine artery and then serially cauterized with the vessel sealer and transected. The uterus was placed on stretch to allow better visualization of the arteries. The bladder flap was further taken down both sharply and with cautery. Cautery was used for hemostasis on several  places along the bladder flap. At this point with the pressure inward on the Rumi, the anterior colpotomy was made with the monopolar scissors. This was carried around to the patient's left side. Additional bipolar cautery was used on the  both angles of the cuff- this was carried out with the vessel sealer. The uterus was then positioned  anteriorly  to allow easy access to the posterior  colpotomy. This was carried out with the monopolar scissors.  Good hemostasis was noted along the angles of the incision.  With manipulation of the specimen, the uterus, cervix,  bilateral tubes and ovaries were pulled out through the vagina.   Irrigation was used and the vaginal cuff appeared hemostatic.  The right vaginal laceration was noted close to the vaginal cuff and was incorporated in the  closure.  A 9 inch V-lock suture was placed though the umbilical port. Instruments were changed to allow a suture cut needle driver through the #1 port and and a long-tipped forceps through the #2 port. The right angle was closed and locked with the V-lock suture. Running suture was carried out to just past the midline and then the needle bent.  It was cut and removed.  I placed another V-Loc suture and started at the left angle and carried that back past the midline.  The vaginal cuff was checked digitally by the assistant and felt to be intact. The suture was cut and removed.   I then backfilled the bladder with 300 cc of saline with methylene blue to assure no defects were noted in the bladder.  The bladder was intact although there was some superficial bleeding from the backside of the bladder.  Arista was placed on the vaginal cuff on the backside of the bladder.  Adequate hemostasis was noted. Suction irrigation was carried out.  The pedicles were reassessed also found to be hemostatic. All free fluid was removed from the abdomen. The robotic portion was completed. The robot was undocked.   By standard laparoscopy the pelvis was evaluated. T the omentum that had been manipulated earlier had a small amount of clot on it but no active bleeding.  He patient was placed in reverse Trendelenburg, all additional fluid was suctioned from the abdomen and pelvis the cuff was reinspected and  found to be hemostatic. All pedicles were also found to be hemostatic. 20 CC 1/4% bupivicaine was placed into the peritoneal cavity.  Under direct visualization the ports were removed. Pneumoperitoneum was released and the umbilical port was removed.    Using S retractors, Kocher clamps and a UR 6 needle a figure-of-eight suture was placed in the umbilical incision. No additional fascial incisions were closed due to the small size and the nondilated nature of these incisions. A 4-0 Vicryl was used to close the additional  laparoscopic ports sites. Good hemostasis was noted.  I then evaluated the vagina.  The right upper laceration close to the vaginal cuff angle had been incorporated in the cuff and was hemostatic.  A small amount of silver nitrate was used on the mid posterior vagina for some mucosal bleeding.  Cystocele and rectocele were noted.  There was a small amount of bleeding from a upper left vagina due to superficial separation/ stretch of the tissue. It was controlled with pressure.  It did not warrant suture  Sponge lap and needle counts were correct x3 the patient was woken from general anesthesia having tolerated the procedure well and taken to the recovery room in a stable  fashion.

## 2022-01-16 NOTE — Transfer of Care (Signed)
Immediate Anesthesia Transfer of Care Note ? ?Patient: Summer Craig ? ?Procedure(s) Performed: XI ROBOTIC ASSISTED TOTAL HYSTERECTOMY WITH BILATERAL SALPINGO OOPHORECTOMY (Bilateral) ? ?Patient Location: PACU ? ?Anesthesia Type:General ? ?Level of Consciousness: sedated ? ?Airway & Oxygen Therapy: Patient connected to face mask oxygen, OPA inplace ? ?Post-op Assessment: Report given to RN ? ?Post vital signs: Reviewed and stable ? ?Last Vitals:  ?Vitals Value Taken Time  ?BP 149/75   ?Temp    ?Pulse 70 01/16/22 1745  ?Resp 13 01/16/22 1745  ?SpO2 100 % 01/16/22 1745  ?Vitals shown include unvalidated device data. ? ?Last Pain:  ?Vitals:  ? 01/16/22 1151  ?TempSrc: Oral  ?PainSc: 0-No pain  ?   ? ?  ? ?Complications: No notable events documented. ?

## 2022-01-17 ENCOUNTER — Encounter (HOSPITAL_BASED_OUTPATIENT_CLINIC_OR_DEPARTMENT_OTHER): Payer: Self-pay | Admitting: Obstetrics

## 2022-01-17 DIAGNOSIS — K219 Gastro-esophageal reflux disease without esophagitis: Secondary | ICD-10-CM | POA: Diagnosis not present

## 2022-01-17 DIAGNOSIS — F319 Bipolar disorder, unspecified: Secondary | ICD-10-CM | POA: Diagnosis not present

## 2022-01-17 DIAGNOSIS — Z7984 Long term (current) use of oral hypoglycemic drugs: Secondary | ICD-10-CM | POA: Diagnosis not present

## 2022-01-17 DIAGNOSIS — C541 Malignant neoplasm of endometrium: Secondary | ICD-10-CM | POA: Diagnosis not present

## 2022-01-17 DIAGNOSIS — Z79899 Other long term (current) drug therapy: Secondary | ICD-10-CM | POA: Diagnosis not present

## 2022-01-17 DIAGNOSIS — Z86718 Personal history of other venous thrombosis and embolism: Secondary | ICD-10-CM | POA: Diagnosis not present

## 2022-01-17 DIAGNOSIS — Z90710 Acquired absence of both cervix and uterus: Secondary | ICD-10-CM | POA: Diagnosis not present

## 2022-01-17 DIAGNOSIS — N879 Dysplasia of cervix uteri, unspecified: Secondary | ICD-10-CM | POA: Diagnosis not present

## 2022-01-17 LAB — CBC
HCT: 31.8 % — ABNORMAL LOW (ref 36.0–46.0)
Hemoglobin: 10.7 g/dL — ABNORMAL LOW (ref 12.0–15.0)
MCH: 31.1 pg (ref 26.0–34.0)
MCHC: 33.6 g/dL (ref 30.0–36.0)
MCV: 92.4 fL (ref 80.0–100.0)
Platelets: 229 10*3/uL (ref 150–400)
RBC: 3.44 MIL/uL — ABNORMAL LOW (ref 3.87–5.11)
RDW: 12.9 % (ref 11.5–15.5)
WBC: 8.4 10*3/uL (ref 4.0–10.5)
nRBC: 0 % (ref 0.0–0.2)

## 2022-01-17 MED ORDER — ACETAMINOPHEN 500 MG PO TABS
ORAL_TABLET | ORAL | Status: AC
  Filled 2022-01-17: qty 2

## 2022-01-17 MED ORDER — HYDROMORPHONE HCL 2 MG PO TABS: 2.0000 mg | ORAL_TABLET | ORAL | 0 refills | Status: DC | PRN

## 2022-01-17 MED ORDER — IBUPROFEN 600 MG PO TABS: 600.0000 mg | ORAL_TABLET | Freq: Four times a day (QID) | ORAL | 1 refills | Status: DC

## 2022-01-17 MED ORDER — KETOROLAC TROMETHAMINE 30 MG/ML IJ SOLN
INTRAMUSCULAR | Status: AC
  Filled 2022-01-17: qty 1

## 2022-01-17 MED ORDER — ACETAMINOPHEN 500 MG PO TABS: 1000.0000 mg | ORAL_TABLET | Freq: Four times a day (QID) | ORAL | 1 refills | Status: DC

## 2022-01-17 MED ORDER — KETOROLAC TROMETHAMINE 30 MG/ML IJ SOLN
INTRAMUSCULAR | Status: AC
Start: 2022-01-17 — End: ?
  Filled 2022-01-17: qty 1

## 2022-01-17 NOTE — Discharge Summary (Signed)
Summer Craig ?MRN: [id number] ?DOB/AGE: Nov 03, 1956 66 y.o. ? ?Admit date: 01/16/2022 ?Discharge date: January 17, 2022 ? ?Admission Diagnoses: Cervical dysplasia [N87.9] ?S/P hysterectomy with oophorectomy [Z90.710, Z90.721] ? ?Discharge Diagnoses: Cervical dysplasia [N87.9] ?S/P hysterectomy with oophorectomy [Z90.710, Z90.721] ?       Principal Problem: ?  Cervical dysplasia ?Active Problems: ?  S/P hysterectomy with oophorectomy ? ? ?Discharged Condition: good ? ?Hospital Course: Patient admitted for planned robotic assisted total hysterectomy with BSO.  Details of that surgery can be found in a separate note but overall uncomplicated surgery. ? ?On postop day 1 patient notes pain controlled overnight with single dose IV Dilaudid, Tylenol and Toradol.  Patient notes minimal vaginal bleeding.  Patient has been up and ambulating.  No chest pain or shortness of breath.  No leg swelling.  Patient has tolerated regular p.o.  Patient notes good void and passing flatus.  Patient feels ready to be discharged home ? ?Physical exam ? ?Vitals:  ? 01/16/22 1930 01/16/22 2130 01/17/22 0239 01/17/22 1638  ?BP: (!) 152/88 140/78 133/67 (!) 127/59  ?Pulse: 72 97 98 (!) 105  ?Resp: '16 16  18  '$ ?Temp: 97.6 ?F (36.4 ?C) 98.1 ?F (36.7 ?C) 99.3 ?F (37.4 ?C) 99.1 ?F (37.3 ?C)  ?TempSrc:      ?SpO2: 100% 96% 95%   ?Weight:      ?Height:      ? ?General: Well-appearing, no distress ?Cardiovascular: Regular rate and rhythm ?Pulmonary: Clear to auscultation bilaterally ?Abdomen: Soft distention, appropriately tender, no rebound or guarding. ?Incisions: Small amount of bruising, no evidence of port site hernia, closed with Dermabond ?GU: Minimal staining on pad ? ? ?CBC Latest Ref Rng & Units 01/17/2022 01/12/2022 12/09/2021  ?WBC 4.0 - 10.5 K/uL 8.4 4.3 4.5  ?Hemoglobin 12.0 - 15.0 g/dL 10.7(L) 12.6 12.4  ?Hematocrit 36.0 - 46.0 % 31.8(L) 39.0 37.1  ?Platelets 150 - 400 K/uL 229 276 263.0  ? ? ? ?Consults: None ? ?Treatments: surgery: Robotic  assisted total laparoscopic hysterectomy with bilateral salpingo-oophorectomy ? ?Disposition: Discharge disposition: 01-Home or Self Care ? ? ? ? ? ? ? ?Allergies as of 01/17/2022   ?No Known Allergies ?  ? ?  ?Medication List  ?  ? ?TAKE these medications   ? ?acetaminophen 500 MG tablet ?Commonly known as: TYLENOL ?Take 2 tablets (1,000 mg total) by mouth every 6 (six) hours. ?  ?fluvoxaMINE 50 MG tablet ?Commonly known as: LUVOX ?Take 1 tablet (50 mg total) by mouth 2 (two) times daily. ?  ?folic acid 1 MG tablet ?Commonly known as: FOLVITE ?Take 1 mg by mouth daily. ?  ?HYDROmorphone 2 MG tablet ?Commonly known as: Dilaudid ?Take 1 tablet (2 mg total) by mouth every 4 (four) hours as needed for severe pain. ?  ?ibuprofen 600 MG tablet ?Commonly known as: ADVIL ?Take 1 tablet (600 mg total) by mouth every 6 (six) hours. ?  ?lamoTRIgine 200 MG tablet ?Commonly known as: LAMICTAL ?Take 200 mg by mouth daily. ?  ?metFORMIN 500 MG 24 hr tablet ?Commonly known as: GLUCOPHAGE-XR ?Take 500 mg by mouth 2 (two) times daily. ?  ?multivitamin with minerals Tabs tablet ?Take 1 tablet by mouth daily. ?  ?OLANZapine 2.5 MG tablet ?Commonly known as: ZYPREXA ?Take 2.5 mg by mouth at bedtime. ?  ? ?  ? ? ? ?Signed: ?Ala Dach, MD ?01/17/2022, 8:22 AM  ?

## 2022-01-19 ENCOUNTER — Other Ambulatory Visit: Payer: Self-pay

## 2022-01-19 DIAGNOSIS — C541 Malignant neoplasm of endometrium: Secondary | ICD-10-CM | POA: Diagnosis not present

## 2022-01-19 DIAGNOSIS — N879 Dysplasia of cervix uteri, unspecified: Secondary | ICD-10-CM | POA: Diagnosis not present

## 2022-01-19 DIAGNOSIS — F3175 Bipolar disorder, in partial remission, most recent episode depressed: Secondary | ICD-10-CM | POA: Diagnosis not present

## 2022-01-25 ENCOUNTER — Telehealth: Payer: Self-pay | Admitting: *Deleted

## 2022-01-25 NOTE — Telephone Encounter (Signed)
Spoke with the patient and scheduled a new patient appt with Dr Berline Lopes on 3/30 at 10:30 am. Patient given an arrival time of 10 am. Patient given the address and phone number for the clinic; along with the policy for mask and visitors ?

## 2022-01-26 ENCOUNTER — Encounter: Payer: Self-pay | Admitting: Family Medicine

## 2022-01-26 DIAGNOSIS — E663 Overweight: Secondary | ICD-10-CM

## 2022-01-26 DIAGNOSIS — R7301 Impaired fasting glucose: Secondary | ICD-10-CM

## 2022-01-26 LAB — SURGICAL PATHOLOGY

## 2022-01-27 ENCOUNTER — Encounter: Payer: Self-pay | Admitting: Gynecologic Oncology

## 2022-01-29 ENCOUNTER — Encounter: Payer: Self-pay | Admitting: Gynecologic Oncology

## 2022-01-29 NOTE — Progress Notes (Signed)
GYNECOLOGIC ONCOLOGY NEW PATIENT CONSULTATION  ? ?Patient Name: Summer Craig  ?Patient Age: 66 y.o. ?Date of Service: 01/30/22 ?Referring Provider: Aloha Gell, MD ? ?Primary Care Provider: Caren Macadam, MD ?Consulting Provider: Jeral Pinch, MD  ? ?Assessment/Plan:  ?Postmenopausal patient with long history of intermittent cervical dysplasia and persistent HPV infection who underwent robotic hysterectomy with an incidental diagnosis of low-grade endometrioid endometrial adenocarcinoma on final pathology. ? ?The patient is doing well from a postoperative standpoint. She has follow-up with her surgeon tomorrow. Pelvic exam was deferred today given upcoming exam.  ? ?We reviewed her pathology report in detail. 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 Myometrial invasion was 17% and no LVI was present. I spoke with pathology during her visit to determine size of tumor. There is no grossly visible tumor within her uterus.  Most of what was seen is complex atypical hyperplasia with a "shotgun" pattern of small foci of cribriform glands consistent with grade 1 endometrial adenocarcinoma.  In terms of continuous tumor, the largest area that could be estimated is approximately 1 cm. Given pathology, her risk of nodal involvement can be estimated to be less than 5%.  ? ?I recommend getting CT to evaluate for any evidence of metastatic disease. If CT is negative, given low risk of lymph node metastasis, I do not recommend additional surgery. We discussed low risk of recurrence but need for close surveillance. This would include visits every 6 months for 5 years per NCCN surveillance guidelines. We will plan to alternate between our office and her OBGYN. We reviewed signs and symptoms that would be concerning for disease recurrence.  ? ?From an endometrial cancer standpoint, routine pap tests are not indicated. Given history of cervical dysplasia, I encouraged her to  discuss with OB/GYN any additional surveillance that she needs given her history of HPV as well as Pap smear history.  I only have her more recent Pap smear history record.  ? ?A copy of this note was sent to the patient's referring provider.  ? ?60 minutes of total time was spent for this patient encounter, including preparation, face-to-face counseling with the patient and coordination of care, and documentation of the encounter. ? ? ?Jeral Pinch, MD  ?Division of Gynecologic Oncology  ?Department of Obstetrics and Gynecology  ?[facility]  ?___________________________________________  ?Chief Complaint: ?Chief Complaint  ?Patient presents with  ? Endometrial cancer [facility])  ? ? ?History of Present Illness:  ?Summer Craig is a 66 y.o. y.o. female who is seen in consultation at the request of Dr. Pamala Hurry for an evaluation of incidentally diagnosed endometrial cancer after hysterectomy for history of cervical dysplasia, HPV infection. ? ?Patient's history was notable for abnormal Pap smears, had undergone 2 LEEP procedures (1 in her 47s, second in her 54s), noted to have HPV her entire life. ?More recent history: ?11/2017-normal Pap smear, high risk HPV negative ?01/2020-normal Pap smear, high risk HPV positive.  Colposcopy showed stenotic cervix, ECC with L SIL ?02/2021-normal Pap, high risk HPV positive, 18/45 positive.  Colposcopy inadequate with benign ECC and no visible lesions on cervix.  Cervix flush with vaginal wall. ? 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  She was not having any vaginal bleeding, discharge, or pelvic pain.  She underwent a pelvic ultrasound exam on 12/29/2021 which revealed a uterus measuring 6  x 3.3 x 3.2 cm.  Trace fluid noted within the endometrium with measurements given of the anterior wall measuring 1.3 mm and  posterior wall measuring 1.1 mm.  Hyperechoic focal areas present extending from the endometrium into the myometrium measuring 1.4 x 0.6 x 1.3 cm.  Left ovary normal appearing, not appreciated.  Given fluid noted within the endometrium as well as endometrial thickening, endometrial biopsy was performed on 12/29/2021 and revealed acute endocervicitis and endocervical microglandular hyperplasia.  No dysplasia or malignancy noted.  No endometrial tissue identified. ? ?On 01/16/2022, patient underwent total robotic hysterectomy with BSO for persistent cervical dysplasia.  Findings at the time of surgery were normal appearing uterus and bilateral adnexa.  Final pathology revealed FIGO grade 1 endometrioid adenocarcinoma arising within the background of complex atypical hyperplasia.  Myometrial invasion 17%, no lymphovascular invasion identified.  Residual cervical dysplasia.  Benign fallopian tubes and ovaries. MMR IHC intact. ? ?Patient reports doing well after surgery.  She has had some very light discharge/bleeding, improving with each day.  She denies any significant abdominal or pelvic pain.  She has been using Aleve but not taking it for pain.  Reports almost normalization of her bowel function.  She denies any bladder symptoms.  Endorses a good appetite. ? ?PAST MEDICAL HISTORY:  ?Past Medical History:  ?Diagnosis Date  ? Arthritis   ? both knees  ? Bipolar disorder ([place])   ? Colon polyp   ? Depression   ? Diverticulitis 2021  ? DVT (deep venous thrombosis) ([place])   ? bunion surgery- post surgery- per patient. Was on blood thinners for short period. More than 10 years ago per pt on 01/10/22.  ? Dysplasia of cervix, low grade (CIN 1) 12/14/2011  ? LEEP procedures x 2  ? Genital warts   ? GERD (gastroesophageal reflux disease)   ? nexiumas needed  ? Herpes genitalia   ? History of hiatal hernia   ? Pre-diabetes   ? Patient takes Metformin and Ozempic to help with blood sugar and weight loss.  ? Urine incontinence   ?   ? ?PAST SURGICAL HISTORY:  ?Past Surgical History:  ?Procedure Laterality Date  ? ARTHRODESIS FOOT WITH WEIL OSTEOTOMY Left 11/09/2020  ? Procedure: LEFT MEDIAL DISPLACEMENT CALCANEAL OSTEOTOMY, POSTERIOR TIBIAL TENDON DEBRIDEMENT AND TENODESIS, FLEXOR DIGITORUM LONGUS TRANSFER, SPRING LIGAMENT RECONSTRUCTION, POSSIBLE TENDO ACHILLES LENGTHENING;  Surgeon: Erle [facility], MD;  Location: [facility];  Service: Orthopedics;  Laterality: Left;  LENGTH OF SURGERY: 2.5 HOURS  ? bunion removal Left   ? BUNIONECTOMY Right   ? COLONOSCOPY  12/02/2019  ? COLONOSCOPY W/ POLYPECTOMY    ? FLEXOR DIGITORUM LONGUS TRANSFER, SPRING LIGAMENT RECONSTRUCTION, POSSIBLE TENDO ACHILLES LENGTHENING (Left   11/09/2020  ? KNEE ARTHROSCOPY Left 2022  ? Menisus tear  ? LEFT MEDIAL DISPLACEMENT CALCANEAL OSTEOTOMY, POSTERIOR TIBIAL TENDON DEBRIDEMENT AND TENODESIS  11/09/2020  ? ROBOTIC ASSISTED TOTAL HYSTERECTOMY WITH BILATERAL SALPINGO OOPHERECTOMY Bilateral 01/16/2022  ? Procedure: XI ROBOTIC ASSISTED TOTAL HYSTERECTOMY WITH BILATERAL SALPINGO OOPHORECTOMY;  Surgeon: Aloha Gell, MD;  Location: [facility];  Service: Gynecology;  Laterality: Bilateral;  Requests 3hrs.  ? TONSILLECTOMY AND ADENOIDECTOMY    ? 66 years old  ? ? ?OB/GYN HISTORY:  ?OB History  ?Gravida Para Term Preterm AB Living  ?2 2          ?SAB IAB Ectopic Multiple Live Births  ?           ?  ?# Outcome Date GA Lbr  Len/2nd Weight Sex Delivery Anes PTL Lv  ?2 Para           ?1 Para           ? ? ?No LMP recorded. Patient is postmenopausal. ? ?Age at menarche: 6 ?Age at menopause: 55 ?Hx of HRT: Denies ?Hx of STDs: Yes ?Last pap: See HPI ?History of abnormal pap smears: Yes ? ?SCREENING STUDIES:  ?Last mammogram: 01/2021  ?Last colonoscopy: 11/2019 ?Last bone mineral density: 2014 ? ?MEDICATIONS: ?Outpatient Encounter Medications as of 01/30/2022  ?Medication Sig  ? fluvoxaMINE (LUVOX) 50 MG tablet Take 1 tablet (50 mg total) by mouth 2 (two) times daily.  ?  folic acid (FOLVITE) 1 MG tablet Take 1 mg by mouth daily.  ? ibuprofen (ADVIL) 600 MG tablet Take 1 tablet (600 mg total) by mouth every 6 (six) hours.  ? lamoTRIgine (LAMICTAL) 200 MG tablet Take 200 mg b

## 2022-01-30 ENCOUNTER — Encounter: Payer: Self-pay | Admitting: Gynecologic Oncology

## 2022-01-30 ENCOUNTER — Other Ambulatory Visit: Payer: Self-pay

## 2022-01-30 ENCOUNTER — Inpatient Hospital Stay: Payer: BC Managed Care – PPO | Attending: Gynecologic Oncology | Admitting: Gynecologic Oncology

## 2022-01-30 VITALS — BP 146/68 | HR 67 | Temp 97.8°F | Resp 16 | Ht 65.35 in | Wt 161.8 lb

## 2022-01-30 DIAGNOSIS — A630 Anogenital (venereal) warts: Secondary | ICD-10-CM | POA: Diagnosis not present

## 2022-01-30 DIAGNOSIS — Z7984 Long term (current) use of oral hypoglycemic drugs: Secondary | ICD-10-CM | POA: Insufficient documentation

## 2022-01-30 DIAGNOSIS — Z79899 Other long term (current) drug therapy: Secondary | ICD-10-CM | POA: Diagnosis not present

## 2022-01-30 DIAGNOSIS — B009 Herpesviral infection, unspecified: Secondary | ICD-10-CM | POA: Diagnosis not present

## 2022-01-30 DIAGNOSIS — Z87410 Personal history of cervical dysplasia: Secondary | ICD-10-CM | POA: Insufficient documentation

## 2022-01-30 DIAGNOSIS — Z90710 Acquired absence of both cervix and uterus: Secondary | ICD-10-CM | POA: Insufficient documentation

## 2022-01-30 DIAGNOSIS — F32A Depression, unspecified: Secondary | ICD-10-CM | POA: Insufficient documentation

## 2022-01-30 DIAGNOSIS — K219 Gastro-esophageal reflux disease without esophagitis: Secondary | ICD-10-CM | POA: Diagnosis not present

## 2022-01-30 DIAGNOSIS — Z8619 Personal history of other infectious and parasitic diseases: Secondary | ICD-10-CM

## 2022-01-30 DIAGNOSIS — E663 Overweight: Secondary | ICD-10-CM

## 2022-01-30 DIAGNOSIS — F319 Bipolar disorder, unspecified: Secondary | ICD-10-CM | POA: Diagnosis not present

## 2022-01-30 DIAGNOSIS — Z86718 Personal history of other venous thrombosis and embolism: Secondary | ICD-10-CM | POA: Insufficient documentation

## 2022-01-30 DIAGNOSIS — C541 Malignant neoplasm of endometrium: Secondary | ICD-10-CM | POA: Insufficient documentation

## 2022-01-30 DIAGNOSIS — Z90722 Acquired absence of ovaries, bilateral: Secondary | ICD-10-CM | POA: Insufficient documentation

## 2022-01-30 NOTE — Patient Instructions (Addendum)
It was a pleasure meeting you today. ? ?Once I have your CT results, I will either send you a message in MyChart or call you to discuss.  Based on final pathology, as we discussed today, your risk of having cancer spread to the lymph nodes appears to be low and I do not think that you would benefit from additional surgery (unless there is something unexpected seen on your CT scan). ? ?We discussed plan for follow-up with visits every 6 months.  These can alternate between my office and your OB/GYN.  We will tentatively plan on having you come back to see me in mid September for your first follow-up visit.  Please call back in July or August to get this scheduled. ? ?Symptoms that would be concerning for cancer recurrence include but are not limited to vaginal bleeding, discharge, pelvic pain, change to bowel function, or unintentional weight loss.  If you were to have any of these, please call my office to see me before your next visit. ? ?We also discussed the testing on your tumor that has been done thus far.  It does not appear that this cancer was genetically caused based on the first test result.  There is an additional test that is in process.  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 ?

## 2022-01-31 DIAGNOSIS — Z1231 Encounter for screening mammogram for malignant neoplasm of breast: Secondary | ICD-10-CM | POA: Diagnosis not present

## 2022-01-31 MED ORDER — WEGOVY 0.25 MG/0.5ML ~~LOC~~ SOAJ: 0.2500 mg | SUBCUTANEOUS | 5 refills | Status: DC

## 2022-02-03 ENCOUNTER — Ambulatory Visit (HOSPITAL_COMMUNITY): Payer: BC Managed Care – PPO

## 2022-02-07 ENCOUNTER — Encounter: Payer: Self-pay | Admitting: Family Medicine

## 2022-02-07 ENCOUNTER — Encounter (HOSPITAL_COMMUNITY): Payer: Self-pay

## 2022-02-07 ENCOUNTER — Other Ambulatory Visit: Payer: Self-pay

## 2022-02-07 ENCOUNTER — Ambulatory Visit (HOSPITAL_COMMUNITY)
Admission: RE | Admit: 2022-02-07 | Discharge: 2022-02-07 | Disposition: A | Discharge status: Home / Self Care | Payer: BC Managed Care – PPO | Source: Ambulatory Visit | Attending: Gynecologic Oncology | Admitting: Gynecologic Oncology

## 2022-02-07 DIAGNOSIS — K449 Diaphragmatic hernia without obstruction or gangrene: Secondary | ICD-10-CM | POA: Diagnosis not present

## 2022-02-07 DIAGNOSIS — C541 Malignant neoplasm of endometrium: Secondary | ICD-10-CM | POA: Insufficient documentation

## 2022-02-07 DIAGNOSIS — C539 Malignant neoplasm of cervix uteri, unspecified: Secondary | ICD-10-CM | POA: Diagnosis not present

## 2022-02-07 DIAGNOSIS — N3289 Other specified disorders of bladder: Secondary | ICD-10-CM | POA: Diagnosis not present

## 2022-02-07 DIAGNOSIS — K5730 Diverticulosis of large intestine without perforation or abscess without bleeding: Secondary | ICD-10-CM | POA: Diagnosis not present

## 2022-02-07 MED ORDER — SODIUM CHLORIDE (PF) 0.9 % IJ SOLN
INTRAMUSCULAR | Status: AC
  Filled 2022-02-07: qty 50

## 2022-02-07 MED ORDER — IOHEXOL 300 MG/ML  SOLN
100.0000 mL | Freq: Once | INTRAMUSCULAR | Status: AC | PRN
  Administered 2022-02-07: 100 mL via INTRAVENOUS

## 2022-02-09 ENCOUNTER — Encounter: Payer: Self-pay | Admitting: Gynecologic Oncology

## 2022-02-09 ENCOUNTER — Ambulatory Visit: Payer: BC Managed Care – PPO | Admitting: Gynecologic Oncology

## 2022-02-09 ENCOUNTER — Encounter: Payer: Self-pay | Admitting: Family Medicine

## 2022-02-09 DIAGNOSIS — R9341 Abnormal radiologic findings on diagnostic imaging of renal pelvis, ureter, or bladder: Secondary | ICD-10-CM

## 2022-02-13 ENCOUNTER — Other Ambulatory Visit (INDEPENDENT_AMBULATORY_CARE_PROVIDER_SITE_OTHER): Payer: BC Managed Care – PPO

## 2022-02-13 DIAGNOSIS — R9341 Abnormal radiologic findings on diagnostic imaging of renal pelvis, ureter, or bladder: Secondary | ICD-10-CM

## 2022-02-13 LAB — URINALYSIS, ROUTINE W REFLEX MICROSCOPIC
Bilirubin Urine: NEGATIVE
Ketones, ur: NEGATIVE
Nitrite: NEGATIVE
Specific Gravity, Urine: <=1.005 — AB (ref 1.000–1.030)
Total Protein, Urine: NEGATIVE
Urine Glucose: NEGATIVE
Urobilinogen, UA: 0.2 (ref 0.0–1.0)
pH: 6 (ref 5.0–8.0)

## 2022-02-14 LAB — URINE CULTURE
MICRO NUMBER:: 13213981
SPECIMEN QUALITY:: ADEQUATE

## 2022-02-16 ENCOUNTER — Encounter: Payer: Self-pay | Admitting: Family Medicine

## 2022-02-20 ENCOUNTER — Telehealth: Payer: Self-pay | Admitting: Family Medicine

## 2022-02-20 NOTE — Telephone Encounter (Signed)
See results note. 

## 2022-02-20 NOTE — Telephone Encounter (Signed)
Pt is calling and would like dr Summer Craig  to review her ct abd scan and call her back. Pt has view her urine results she did not realized the md had commented  ?

## 2022-02-21 ENCOUNTER — Encounter: Payer: Self-pay | Admitting: Gynecologic Oncology

## 2022-02-21 NOTE — Telephone Encounter (Signed)
After extensive review of pt chart, pt had questions about the thickening of the bladder wall. Explained that Dr Berline Lopes did not mention it & her result note of Abdm CT, but later requested a urine sample to investigate further. Pt then contacted PCP to drop of urine sample. Urine culture resulted (was clear) & pt read result note.  ? ?Pt states she still very concerned about the thickening of her bladder wall & would like an explanation of why her bladder wall is thick. Of note: bladder wall thickening also noted in 2020 CT. ?

## 2022-02-27 NOTE — Telephone Encounter (Signed)
I agree with Dr. Berline Lopes that if willing patient could see urology for review of imaging results/follow up. They could do a more direct visualization of bladder wall would would likely bemore informative. Wall can be thickened with infection (there wasn't one) or even just irritation (if there is inflammatory process happening in surrounding area) or sometimes can appear thickened (like if dehydrated). Urology will be able to comment more accurately and complete follow up eval if they feel warranted.  ?

## 2022-02-28 NOTE — Telephone Encounter (Addendum)
Patient informed of the message below.  Patient stated she would like to see a urologist within the [place] and I informed her the only urology office here in [facility] would be [facility], there are a number of providers within the group and they are not within the [place]. ?

## 2022-03-20 DIAGNOSIS — D692 Other nonthrombocytopenic purpura: Secondary | ICD-10-CM | POA: Diagnosis not present

## 2022-03-20 DIAGNOSIS — D225 Melanocytic nevi of trunk: Secondary | ICD-10-CM | POA: Diagnosis not present

## 2022-03-20 DIAGNOSIS — D1801 Hemangioma of skin and subcutaneous tissue: Secondary | ICD-10-CM | POA: Diagnosis not present

## 2022-03-20 DIAGNOSIS — L814 Other melanin hyperpigmentation: Secondary | ICD-10-CM | POA: Diagnosis not present

## 2022-03-20 DIAGNOSIS — L820 Inflamed seborrheic keratosis: Secondary | ICD-10-CM | POA: Diagnosis not present

## 2022-03-21 ENCOUNTER — Ambulatory Visit (HOSPITAL_COMMUNITY)
Admission: EM | Admit: 2022-03-21 | Discharge: 2022-03-21 | Disposition: A | Discharge status: Home / Self Care | Payer: BC Managed Care – PPO | Attending: Internal Medicine | Admitting: Internal Medicine

## 2022-03-21 ENCOUNTER — Encounter (HOSPITAL_COMMUNITY): Payer: Self-pay

## 2022-03-21 DIAGNOSIS — H1033 Unspecified acute conjunctivitis, bilateral: Secondary | ICD-10-CM | POA: Diagnosis not present

## 2022-03-21 MED ORDER — GENTAMICIN SULFATE 0.3 % OP SOLN: 2.0000 [drp] | OPHTHALMIC | 0 refills | Status: DC

## 2022-03-21 MED ORDER — BENZONATATE 100 MG PO CAPS: 100.0000 mg | ORAL_CAPSULE | Freq: Three times a day (TID) | ORAL | 0 refills | Status: DC

## 2022-03-21 MED ORDER — GUAIFENESIN ER 1200 MG PO TB12: 1200.0000 mg | ORAL_TABLET | Freq: Two times a day (BID) | ORAL | 0 refills | Status: DC

## 2022-03-21 NOTE — Discharge Instructions (Addendum)
You were seen today for bilateral conjunctivitis.  Please place 2 drops of gentamicin eyedrops into both of your eyes every 4 hours while you are awake for the next 5 days.  Please wash your hands frequently and avoid scratching your eyes.  ? ?Please take Mucinex twice daily for nasal congestion and cough.  Please make sure to drink plenty of water while you take this medication as it will not work in your body if you do not drink plenty of water.  You may take Tessalon Perles 3 times a day for your cough.  This medication will not make you sleepy, so you may take it during the day. ? ?If you experience any blurry vision, decreased visual acuity, or any other worsening symptoms in the next 2 to 3 days, please return to the urgent care. If your symptoms are severe, go to the ER.  Otherwise, follow-up with your primary care provider for further evaluation and management of your symptoms.  ?

## 2022-03-21 NOTE — ED Provider Notes (Signed)
[facility]    CSN: [id number] Arrival date & time: 03/21/22  0907      History   Chief Complaint Chief Complaint  Patient presents with   Eye Drainage    bilateral    HPI Summer Craig is a 66 y.o. female.   Patient presents to urgent care with complaint of eye drainage to right eye that started 3 days ago on Saturday. The next day on Sunday (2 days ago) her symptoms spread to her left eye. She states both of her eyes feel "grainy" and "goopy" when she wakes up in the morning with yellow drainage.  Redness present to both eyes.  She denies sensitivity to light, but does report pain to eyes bilaterally.  She has tried Systane eyedrops with no relief of her symptoms.  She is avoided wearing a lot of make-up to her eyes for the last couple of days since her symptoms started.  For the past week, she has had nasal congestion, cough, and headache. Nasal congestion began as clear and has now progressed to green and yellow in color. Her cough is productive with green and yellow phlegm. She denies any sick contacts. She has been taking cetirizine and delsym for cough/congestion. She was vaccinated for COVID-19 and flu last year. She has a history of chronic sinusitis and has even had a surgical procedure for this. She denies allergies to medications.  Denies fever, nausea, vomiting, diarrhea, dizziness, chest pain, and weakness. Denies any other aggravating or relieving factors.     Past Medical History:  Diagnosis Date   Arthritis    both knees   Bipolar disorder (HCC)    Colon polyp    Depression    Diverticulitis 2021   DVT (deep venous thrombosis) (HCC)    bunion surgery- post surgery- per patient. Was on blood thinners for short period. More than 10 years ago per pt on 01/10/22.   Dysplasia of cervix, low grade (CIN 1) 12/14/2011   LEEP procedures x 2   Genital warts    GERD (gastroesophageal reflux disease)    nexiumas needed   Herpes genitalia    History of hiatal  hernia    Pre-diabetes    Patient takes Metformin and Ozempic to help with blood sugar and weight loss.   Urine incontinence     Patient Active Problem List   Diagnosis Date Noted   Endometrial cancer (HCC) 01/30/2022   Cervical dysplasia 01/16/2022   S/P hysterectomy with oophorectomy 01/16/2022   Impaired fasting glucose 01/11/2022   Overweight (BMI 25.0-29.9) 01/11/2022   Posterior tibial tendon dysfunction (PTTD) of left lower extremity 11/09/2020   Bipolar disorder (HCC) 12/12/2019   Hiatal hernia 12/12/2019   Diverticulosis 12/12/2019   Posterior tibial tendonitis of right leg 07/24/2018    Past Surgical History:  Procedure Laterality Date   ARTHRODESIS FOOT WITH WEIL OSTEOTOMY Left 11/09/2020   Procedure: LEFT MEDIAL DISPLACEMENT CALCANEAL OSTEOTOMY, POSTERIOR TIBIAL TENDON DEBRIDEMENT AND TENODESIS, FLEXOR DIGITORUM LONGUS TRANSFER, SPRING LIGAMENT RECONSTRUCTION, POSSIBLE TENDO ACHILLES LENGTHENING;  Surgeon: Terance Hart, MD;  Location: [facility];  Service: Orthopedics;  Laterality: Left;  LENGTH OF SURGERY: 2.5 HOURS   bunion removal Left    BUNIONECTOMY Right    COLONOSCOPY  12/02/2019   COLONOSCOPY W/ POLYPECTOMY     FLEXOR DIGITORUM LONGUS TRANSFER, SPRING LIGAMENT RECONSTRUCTION, POSSIBLE TENDO ACHILLES LENGTHENING (Left   11/09/2020   KNEE ARTHROSCOPY Left 2022   Menisus tear   LEFT MEDIAL DISPLACEMENT CALCANEAL OSTEOTOMY, POSTERIOR  TIBIAL TENDON DEBRIDEMENT AND TENODESIS  11/09/2020   ROBOTIC ASSISTED TOTAL HYSTERECTOMY WITH BILATERAL SALPINGO OOPHERECTOMY Bilateral 01/16/2022   Procedure: XI ROBOTIC ASSISTED TOTAL HYSTERECTOMY WITH BILATERAL SALPINGO OOPHORECTOMY;  Surgeon: Noland Fordyce, MD;  Location: [facility];  Service: Gynecology;  Laterality: Bilateral;  Requests 3hrs.   TONSILLECTOMY AND ADENOIDECTOMY     66 years old    OB History     Gravida  2   Para  2   Term      Preterm      AB      Living         SAB      IAB       Ectopic      Multiple      Live Births               Home Medications    Prior to Admission medications   Medication Sig Start Date End Date Taking? Authorizing Provider  benzonatate (TESSALON) 100 MG capsule Take 1 capsule (100 mg total) by mouth every 8 (eight) hours. 03/21/22  Yes Carlisle Beers, FNP  gentamicin (GARAMYCIN) 0.3 % ophthalmic solution Place 2 drops into both eyes every 4 (four) hours. 03/21/22  Yes Carlisle Beers, FNP  Guaifenesin 1200 MG TB12 Take 1 tablet (1,200 mg total) by mouth in the morning and at bedtime. 03/21/22  Yes Carlisle Beers, FNP  fluvoxaMINE (LUVOX) 50 MG tablet Take 1 tablet (50 mg total) by mouth 2 (two) times daily. 12/12/19   Wynn Banker, MD  folic acid (FOLVITE) 1 MG tablet Take 1 mg by mouth daily. 06/03/18   [provider]  ibuprofen (ADVIL) 600 MG tablet Take 1 tablet (600 mg total) by mouth every 6 (six) hours. 01/17/22   Noland Fordyce, MD  lamoTRIgine (LAMICTAL) 200 MG tablet Take 200 mg by mouth daily. 05/01/18   [provider]  metFORMIN (GLUCOPHAGE-XR) 500 MG 24 hr tablet Take 500 mg by mouth 2 (two) times daily. 07/01/18   [provider]  Multiple Vitamin (MULTIVITAMIN WITH MINERALS) TABS tablet Take 1 tablet by mouth daily.    [provider]  OLANZapine (ZYPREXA) 2.5 MG tablet Take 2.5 mg by mouth at bedtime. 07/13/18   [provider]  Semaglutide-Weight Management (WEGOVY) 0.25 MG/0.5ML SOAJ Inject 0.25 mg into the skin once a week. 01/31/22   Wynn Banker, MD    Family History Family History  Problem Relation Age of Onset   Sleep apnea Mother    Arthritis Mother    Alzheimer's disease Mother    Brain cancer Father    Alzheimer's disease Maternal Grandmother    Colon cancer Neg Hx    Breast cancer Neg Hx    Ovarian cancer Neg Hx    Endometrial cancer Neg Hx    Pancreatic cancer Neg Hx    Prostate cancer Neg Hx     Social History Social  History   Tobacco Use   Smoking status: Never   Smokeless tobacco: Never  Vaping Use   Vaping Use: Never used  Substance Use Topics   Alcohol use: Yes    Alcohol/week: 1.0 standard drink    Types: 1 Glasses of wine per week    Comment: socially - infrequent   Drug use: Never     Allergies   Patient has no known allergies.   Review of Systems Review of Systems Per HPI  Physical Exam Triage Vital Signs ED Triage  Vitals [03/21/22 0946]  Enc Vitals Group     BP 106/64     Pulse Rate 73     Resp 18     Temp 97.6 F (36.4 C)     Temp Source Oral     SpO2 100 %     Weight      Height      Head Circumference      Peak Flow      Pain Score 5     Pain Loc      Pain Edu?      Excl. in GC?    No data found.  Updated Vital Signs BP 106/64 (BP Location: Left Arm)   Pulse 73   Temp 97.6 F (36.4 C) (Oral)   Resp 18   SpO2 100%   Visual Acuity Right Eye Distance: 20/25 Left Eye Distance: 20/30 Bilateral Distance: 20/25  Right Eye Near:   Left Eye Near:    Bilateral Near:     Physical Exam Vitals and nursing note reviewed.  Constitutional:      General: She is not in acute distress.    Appearance: Normal appearance. She is well-developed. She is not ill-appearing.  HENT:     Head: Normocephalic and atraumatic.     Right Ear: Hearing, tympanic membrane, ear canal and external ear normal.     Left Ear: Hearing, tympanic membrane, ear canal and external ear normal.     Nose: Congestion and rhinorrhea present.     Right Turbinates: Swollen.     Left Turbinates: Swollen.     Comments: Erythematous nasal passageways with minimal nasal congestion noted to physical exam.    Mouth/Throat:     Lips: Pink.     Mouth: Mucous membranes are moist.     Tongue: No lesions.     Pharynx: Oropharynx is clear. Uvula midline. Posterior oropharyngeal erythema present. No pharyngeal swelling, oropharyngeal exudate or uvula swelling.     Tonsils: No tonsillar exudate or  tonsillar abscesses.     Comments: Mild erythema and postnasal drainage that is clear noted to posterior oropharynx.  Airway is intact. Eyes:     General: Lids are normal. Vision grossly intact. Gaze aligned appropriately. No allergic shiner, visual field deficit or scleral icterus.    Extraocular Movements: Extraocular movements intact.     Conjunctiva/sclera:     Right eye: Right conjunctiva is injected. Exudate present.     Left eye: Left conjunctiva is injected. Exudate present.     Comments: White and yellow drainage from patient's bilateral eyes visualized.  Conjunctiva appear injected.   Cardiovascular:     Rate and Rhythm: Normal rate and regular rhythm.     Pulses: Normal pulses.     Heart sounds: Normal heart sounds. No murmur heard.   No friction rub. No gallop.  Pulmonary:     Effort: Pulmonary effort is normal. No respiratory distress.     Breath sounds: Normal breath sounds. No wheezing, rhonchi or rales.  Chest:     Chest wall: No tenderness.  Abdominal:     Palpations: Abdomen is soft.     Tenderness: There is no abdominal tenderness.  Musculoskeletal:        General: No swelling.     Cervical back: Normal range of motion and neck supple. No tenderness.  Lymphadenopathy:     Cervical: Cervical adenopathy present.  Skin:    General: Skin is warm and dry.     Capillary Refill: Capillary refill  takes less than 2 seconds.     Findings: No rash.  Neurological:     General: No focal deficit present.     Mental Status: She is alert and oriented to person, place, and time.  Psychiatric:        Mood and Affect: Mood normal.        Behavior: Behavior normal.        Thought Content: Thought content normal.        Judgment: Judgment normal.     UC Treatments / Results  Labs (all labs ordered are listed, but only abnormal results are displayed) Labs Reviewed - No data to display  EKG   Radiology No results found.  Procedures Procedures (including critical care  time)  Medications Ordered in UC Medications - No data to display  Initial Impression / Assessment and Plan / UC Course  I have reviewed the triage vital signs and the nursing notes.  Pertinent labs & imaging results that were available during my care of the patient were reviewed by me and considered in my medical decision making (see chart for details).  Patient is a 66 year old female who presents today with bilateral conjunctival injection, eye drainage from both eyes, nasal congestion, and cough.  Her symptoms are likely caused by an upper respiratory infection that is viral in nature, but plan to treat conjunctivitis with gentamicin antibiotic eyedrops 2 drops into the eyes every 4 hours for 5 days for potential bacterial cause to eye drainage and injection.  Patient's vision is grossly intact at this time and she denies any change to her visual acuity and denies blurry vision.  Suspect gentamicin eyedrops will help to clear up her conjunctivitis in the next few days.   Low suspicion for acute postviral bacterial sinusitis today.  Deferred antibiotic prescription based on stable HEENT physical exam findings of no tenderness to her frontal or maxillary sinuses and minimal congestion in her turbinates. She is also afebrile in the clinic as well as at home. Deferred imaging today based on stable cardiopulmonary exam and vital signs.  Plan to treat viral upper respiratory infection with guaifenesin twice daily and Tessalon Perles for cough.  Patient encouraged to drink plenty of water while taking guaifenesin as this will help at work better in her body.  Counseled patient regarding appropriate use of medications and potential side effects for all medications recommended or prescribed today. Discussed red flag signs and symptoms of worsening condition,when to call the PCP office, return to urgent care, and when to seek higher level of care. Patient verbalizes understanding and agreement with plan. All  questions answered. Patient discharged in stable condition.   Final Clinical Impressions(s) / UC Diagnoses   Final diagnoses:  Acute bacterial conjunctivitis of both eyes     Discharge Instructions      You were seen today for bilateral conjunctivitis.  Please place 2 drops of gentamicin eyedrops into both of your eyes every 4 hours while you are awake for the next 5 days.  Please wash your hands frequently and avoid scratching your eyes.   Please take Mucinex twice daily for nasal congestion and cough.  Please make sure to drink plenty of water while you take this medication as it will not work in your body if you do not drink plenty of water.  You may take Tessalon Perles 3 times a day for your cough.  This medication will not make you sleepy, so you may take it during the day.  If you experience any blurry vision, decreased visual acuity, or any other worsening symptoms in the next 2 to 3 days, please return to the urgent care. If your symptoms are severe, go to the ER.  Otherwise, follow-up with your primary care provider for further evaluation and management of your symptoms.     ED Prescriptions     Medication Sig Dispense Auth. Provider   gentamicin (GARAMYCIN) 0.3 % ophthalmic solution Place 2 drops into both eyes every 4 (four) hours. 5 mL Reita May M, FNP   Guaifenesin 1200 MG TB12 Take 1 tablet (1,200 mg total) by mouth in the morning and at bedtime. 14 tablet Reita May M, FNP   benzonatate (TESSALON) 100 MG capsule Take 1 capsule (100 mg total) by mouth every 8 (eight) hours. 21 capsule Carlisle Beers, FNP      PDMP not reviewed this encounter.   Carlisle Beers, FNP 03/21/22 1140

## 2022-03-21 NOTE — ED Triage Notes (Signed)
Onset yesterday of bilateral eye drainage and irritation. Pt c/o accumulating yellow discharge. Has tried eyedrops w/o relief.  ?

## 2022-04-28 ENCOUNTER — Ambulatory Visit (INDEPENDENT_AMBULATORY_CARE_PROVIDER_SITE_OTHER): Payer: Self-pay | Admitting: Plastic Surgery

## 2022-04-28 VITALS — BP 144/84 | HR 72 | Temp 98.2°F | Resp 18 | Ht 65.5 in | Wt 156.0 lb

## 2022-04-28 DIAGNOSIS — F3170 Bipolar disorder, currently in remission, most recent episode unspecified: Secondary | ICD-10-CM

## 2022-04-28 DIAGNOSIS — Z719 Counseling, unspecified: Secondary | ICD-10-CM

## 2022-04-28 NOTE — Progress Notes (Unsigned)
Patient ID: Summer Craig, female    DOB: 10-17-1956, 66 y.o.   MRN: [id number]   Chief Complaint  Patient presents with   Consult    The patient is a 66 year old female here for evaluation of her face.  The patient has had some lasers in the past and is very concerned that it may have caused hollowing of her cheeks.  She is inquiring if there is anything she can do to make her cheeks fuller.  She is not    Review of Systems  Constitutional: Negative.   Eyes: Negative.   Respiratory: Negative.    Cardiovascular: Negative.   Gastrointestinal: Negative.   Endocrine: Negative.   Genitourinary: Negative.     Past Medical History:  Diagnosis Date   Arthritis    both knees   Bipolar disorder ([place])    Colon polyp    Depression    Diverticulitis 2021   DVT (deep venous thrombosis) (HCC)    bunion surgery- post surgery- per patient. Was on blood thinners for short period. More than 10 years ago per pt on 01/10/22.   Dysplasia of cervix, low grade (CIN 1) 12/14/2011   LEEP procedures x 2   Genital warts    GERD (gastroesophageal reflux disease)    nexiumas needed   Herpes genitalia    History of hiatal hernia    Pre-diabetes    Patient takes Metformin and Ozempic to help with blood sugar and weight loss.   Urine incontinence     Past Surgical History:  Procedure Laterality Date   ARTHRODESIS FOOT WITH WEIL OSTEOTOMY Left 11/09/2020   Procedure: LEFT MEDIAL DISPLACEMENT CALCANEAL OSTEOTOMY, POSTERIOR TIBIAL TENDON DEBRIDEMENT AND TENODESIS, FLEXOR DIGITORUM LONGUS TRANSFER, SPRING LIGAMENT RECONSTRUCTION, POSSIBLE TENDO ACHILLES LENGTHENING;  Surgeon: Erle Crocker, MD;  Location: [facility];  Service: Orthopedics;  Laterality: Left;  LENGTH OF SURGERY: 2.5 HOURS   bunion removal Left    BUNIONECTOMY Right    COLONOSCOPY  12/02/2019   COLONOSCOPY W/ POLYPECTOMY     FLEXOR DIGITORUM LONGUS TRANSFER, SPRING LIGAMENT RECONSTRUCTION, POSSIBLE TENDO ACHILLES LENGTHENING  (Left   11/09/2020   KNEE ARTHROSCOPY Left 2022   Menisus tear   LEFT MEDIAL DISPLACEMENT CALCANEAL OSTEOTOMY, POSTERIOR TIBIAL TENDON DEBRIDEMENT AND TENODESIS  11/09/2020   ROBOTIC ASSISTED TOTAL HYSTERECTOMY WITH BILATERAL SALPINGO OOPHERECTOMY Bilateral 01/16/2022   Procedure: XI ROBOTIC ASSISTED TOTAL HYSTERECTOMY WITH BILATERAL SALPINGO OOPHORECTOMY;  Surgeon: Aloha Gell, MD;  Location: [facility];  Service: Gynecology;  Laterality: Bilateral;  Requests 3hrs.   TONSILLECTOMY AND ADENOIDECTOMY     66 years old      Current Outpatient Medications:    fluvoxaMINE (LUVOX) 50 MG tablet, Take 1 tablet (50 mg total) by mouth 2 (two) times daily., Disp: , Rfl: 1   folic acid (FOLVITE) 1 MG tablet, Take 1 mg by mouth daily., Disp: , Rfl: 2   lamoTRIgine (LAMICTAL) 200 MG tablet, Take 200 mg by mouth daily., Disp: , Rfl: 1   metFORMIN (GLUCOPHAGE-XR) 500 MG 24 hr tablet, Take 500 mg by mouth 2 (two) times daily., Disp: , Rfl: 2   Multiple Vitamin (MULTIVITAMIN WITH MINERALS) TABS tablet, Take 1 tablet by mouth daily., Disp: , Rfl:    OLANZapine (ZYPREXA) 2.5 MG tablet, Take 2.5 mg by mouth at bedtime., Disp: , Rfl: 0   benzonatate (TESSALON) 100 MG capsule, Take 1 capsule (100 mg total) by mouth every 8 (eight) hours., Disp: 21 capsule, Rfl: 0  Guaifenesin 1200 MG TB12, Take 1 tablet (1,200 mg total) by mouth in the morning and at bedtime., Disp: 14 tablet, Rfl: 0   ibuprofen (ADVIL) 600 MG tablet, Take 1 tablet (600 mg total) by mouth every 6 (six) hours. (Patient not taking: Reported on 04/28/2022), Disp: 60 tablet, Rfl: 1   Semaglutide-Weight Management (WEGOVY) 0.25 MG/0.5ML SOAJ, Inject 0.25 mg into the skin once a week. (Patient not taking: Reported on 04/28/2022), Disp: 2 mL, Rfl: 5   Objective:   Vitals:   04/28/22 1426  BP: (!) 144/84  Pulse: 72  Resp: 18  Temp: 98.2 F (36.8 C)  SpO2: 98%    Physical Exam Vitals reviewed.  Constitutional:      Appearance:  Normal appearance.  HENT:     Head: Normocephalic and atraumatic.  Cardiovascular:     Rate and Rhythm: Normal rate.     Pulses: Normal pulses.  Skin:    Capillary Refill: Capillary refill takes less than 2 seconds.     Coloration: Skin is not jaundiced.     Findings: No bruising or lesion.  Neurological:     Mental Status: She is alert and oriented to person, place, and time.  Psychiatric:        Mood and Affect: Mood normal.        Behavior: Behavior normal.        Thought Content: Thought content normal.     Assessment & Plan:  No diagnosis found.  ***  [place], DO

## 2022-05-01 ENCOUNTER — Encounter: Payer: Self-pay | Admitting: Plastic Surgery

## 2022-05-01 DIAGNOSIS — Z719 Counseling, unspecified: Secondary | ICD-10-CM | POA: Insufficient documentation

## 2022-05-10 ENCOUNTER — Institutional Professional Consult (permissible substitution): Payer: BC Managed Care – PPO | Admitting: Plastic Surgery

## 2022-05-17 ENCOUNTER — Telehealth: Payer: Self-pay | Admitting: *Deleted

## 2022-05-17 NOTE — Telephone Encounter (Signed)
LMOM for the patient to call the office back. Patient needs a follow up appt with Dr Berline Lopes in September

## 2022-05-17 NOTE — Telephone Encounter (Signed)
Pt called office, returning Michelle's call, regarding a follow up appointment with Dr.Tucker. Pt is scheduled on 07/20/22 @ 2:00pm

## 2022-05-25 DIAGNOSIS — H524 Presbyopia: Secondary | ICD-10-CM | POA: Diagnosis not present

## 2022-05-25 DIAGNOSIS — H2513 Age-related nuclear cataract, bilateral: Secondary | ICD-10-CM | POA: Diagnosis not present

## 2022-06-22 DIAGNOSIS — F3175 Bipolar disorder, in partial remission, most recent episode depressed: Secondary | ICD-10-CM | POA: Diagnosis not present

## 2022-07-20 ENCOUNTER — Ambulatory Visit: Payer: BC Managed Care – PPO | Admitting: Gynecologic Oncology

## 2022-07-25 ENCOUNTER — Encounter: Payer: BC Managed Care – PPO | Admitting: Family Medicine

## 2022-07-26 ENCOUNTER — Telehealth: Payer: Self-pay

## 2022-07-26 NOTE — Telephone Encounter (Signed)
Pt called office stating she was supposed to be seen on 9/7 and had to reschedule to 10/10 with Dr.Tucker. She has been having infrequent diarrhea/ Left side pelvic pain x 2 weeks. She states the diarrhea, at times, is non stop. Left side pain is a 2-3/10 on pain scale. It is more of a nagging. Pain is constant and doesn't go away with the diarrhea and diarrhea doesn't come after pain starts. No vaginal bleeding/discharge. No fever/chills, no N/V.   Pt states she has a history of diverticulitis and has tried to get in to see her internalist and they can not see her until 9/26. She was wondering if Dr.Tucker could see her any time before then.   Pt aware there are no available appointments for [facility] before 10/10. Notified her I would still send a message to get advise.

## 2022-07-26 NOTE — Telephone Encounter (Signed)
Per Joylene John NP, recommended GI provider who did her scope in 2021 [facility]) Pt stated an understanding and was thankful for the call and direction of which she should go.

## 2022-08-08 ENCOUNTER — Encounter: Payer: Self-pay | Admitting: Family Medicine

## 2022-08-08 ENCOUNTER — Ambulatory Visit: Payer: BC Managed Care – PPO | Admitting: Family Medicine

## 2022-08-08 VITALS — BP 118/78 | HR 66 | Temp 98.0°F | Ht 65.5 in | Wt 163.2 lb

## 2022-08-08 DIAGNOSIS — R7301 Impaired fasting glucose: Secondary | ICD-10-CM

## 2022-08-08 DIAGNOSIS — K5790 Diverticulosis of intestine, part unspecified, without perforation or abscess without bleeding: Secondary | ICD-10-CM

## 2022-08-08 NOTE — Progress Notes (Unsigned)
Established Patient Office Visit  Subjective   Patient ID: Summer Craig, female    DOB: 05-09-1956  Age: 66 y.o. MRN: [id number]  Chief Complaint  Patient presents with   Establish Care    Patient is here for transition of care visit. She reports a history of diverticulitis. States that she has not had any flare ups or symptoms recently. States she did have a little LLQ cramping, but this has resolved at this point. States she saw a GI physician who recommended that she take fiber supplements.   Bipolar disorder-- pt is on luvox, zyprexa and lamotrigine. She is seen by the [facility] clinic for these medications. She reports they work well and her symptoms are controlled.   ?preDM -- patient is on metformin 500 mg BID for control of blood sugar which is well controlled. Last A1C was done in January 2023 and was 5.6. This elevated in blood glucose could be from her psychiatric medications....    Current Outpatient Medications  Medication Instructions   fluvoxaMINE (LUVOX) 50 mg, Oral, 2 times daily   lamoTRIgine (LAMICTAL) 200 mg, Oral, Daily   metFORMIN (GLUCOPHAGE-XR) 500 mg, Oral, 2 times daily   Multiple Vitamin (MULTIVITAMIN WITH MINERALS) TABS tablet 1 tablet, Oral, Daily   OLANZapine (ZYPREXA) 2.5 mg, Oral, Daily at bedtime     Patient Active Problem List   Diagnosis Date Noted   Encounter for counseling 05/01/2022   Endometrial cancer ([place]) 01/30/2022   Cervical dysplasia 01/16/2022   S/P hysterectomy with oophorectomy 01/16/2022   Impaired fasting glucose 01/11/2022   Overweight (BMI 25.0-29.9) 01/11/2022   Posterior tibial tendon dysfunction (PTTD) of left lower extremity 11/09/2020   Bipolar disorder ([place]) 12/12/2019   Hiatal hernia 12/12/2019   Diverticulosis 12/12/2019   Posterior tibial tendonitis of right leg 07/24/2018      Review of Systems  All other systems reviewed and are negative.     Objective:     BP 118/78 (BP Location: Left Arm,  Patient Position: Sitting, Cuff Size: Normal)   Pulse 66   Temp 98 F (36.7 C) (Oral)   Ht 5' 5.5" (1.664 m)   Wt 163 lb 3.2 oz (74 kg)   SpO2 99%   BMI 26.74 kg/m  BP Readings from Last 3 Encounters:  08/08/22 118/78  04/28/22 (!) 144/84  03/21/22 106/64      Physical Exam Constitutional:      Appearance: Normal appearance. She is normal weight.  Eyes:     Conjunctiva/sclera: Conjunctivae normal.  Cardiovascular:     Rate and Rhythm: Normal rate and regular rhythm.     Pulses: Normal pulses.  Pulmonary:     Effort: Pulmonary effort is normal.     Breath sounds: Normal breath sounds.  Abdominal:     General: Bowel sounds are normal.  Skin:    General: Skin is warm and dry.  Neurological:     General: No focal deficit present.     Mental Status: She is alert and oriented to person, place, and time.      No results found for any visits on 08/08/22.  Last lipids Lab Results  Component Value Date   CHOL 229 (H) 12/09/2021   HDL 74.80 12/09/2021   LDLCALC 127 (H) 12/09/2021   TRIG 138.0 12/09/2021   CHOLHDL 3 12/09/2021   Last hemoglobin A1c Lab Results  Component Value Date   HGBA1C 5.6 12/09/2021      The 10-year ASCVD risk score (Arnett DK,  et al., 2019) is: 4%    Assessment & Plan:   Problem List Items Addressed This Visit       Digestive   Diverticulosis - Primary    No current symptoms, she will continue the diverticulosis diet and will monitor for any signs of flare up.         Endocrine   Impaired fasting glucose    Last A1C was WNL, her issues might be stemming from her psychiatric medications. will recheck this at her annual physical in 4 months.        Return in about 4 months (around 12/08/2022) for Annual Physical Exam.    Farrel Conners, MD

## 2022-08-10 NOTE — Assessment & Plan Note (Signed)
No current symptoms, she will continue the diverticulosis diet and will monitor for any signs of flare up.

## 2022-08-10 NOTE — Assessment & Plan Note (Signed)
Last A1C was WNL, her issues might be stemming from her psychiatric medications. will recheck this at her annual physical in 4 months.

## 2022-08-21 ENCOUNTER — Encounter: Payer: Self-pay | Admitting: Gynecologic Oncology

## 2022-08-21 DIAGNOSIS — H43812 Vitreous degeneration, left eye: Secondary | ICD-10-CM | POA: Diagnosis not present

## 2022-08-21 DIAGNOSIS — H5310 Unspecified subjective visual disturbances: Secondary | ICD-10-CM | POA: Diagnosis not present

## 2022-08-22 ENCOUNTER — Inpatient Hospital Stay: Payer: BC Managed Care – PPO | Attending: Gynecologic Oncology | Admitting: Gynecologic Oncology

## 2022-08-22 ENCOUNTER — Encounter: Payer: Self-pay | Admitting: Gynecologic Oncology

## 2022-08-22 VITALS — BP 127/66 | HR 68 | Temp 97.5°F | Resp 20 | Ht 65.0 in | Wt 168.0 lb

## 2022-08-22 DIAGNOSIS — Z90722 Acquired absence of ovaries, bilateral: Secondary | ICD-10-CM | POA: Insufficient documentation

## 2022-08-22 DIAGNOSIS — Z87410 Personal history of cervical dysplasia: Secondary | ICD-10-CM

## 2022-08-22 DIAGNOSIS — Z90710 Acquired absence of both cervix and uterus: Secondary | ICD-10-CM | POA: Insufficient documentation

## 2022-08-22 DIAGNOSIS — C541 Malignant neoplasm of endometrium: Secondary | ICD-10-CM

## 2022-08-22 DIAGNOSIS — A630 Anogenital (venereal) warts: Secondary | ICD-10-CM | POA: Diagnosis not present

## 2022-08-22 DIAGNOSIS — Z8619 Personal history of other infectious and parasitic diseases: Secondary | ICD-10-CM

## 2022-08-22 NOTE — Patient Instructions (Signed)
It was good to see you today.  I do not see or feel any evidence of cancer recurrence on your exam.  I will see you for follow-up in 12 months. Please call over the summer next year to schedule a visit to see me in 08/2023.  Please reach out to your OBGYN for a visit in March 2023. Please remember to discuss follow-up given your pap smear history.   As always, if you develop any new and concerning symptoms before your next visit, please call to see me sooner.

## 2022-08-22 NOTE — Progress Notes (Addendum)
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 More recent history: 11/2017-normal Pap smear, high risk HPV negative 01/2020-normal Pap smear, high risk HPV positive.  Colposcopy showed stenotic cervix, ECC with L SIL 02/2021-normal Pap, high risk HPV positive, 18/45 positive.  Colposcopy inadequate with benign ECC and no visible lesions on cervix.  Cervix flush with vaginal wall.   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  She was not having any vaginal bleeding, discharge, or pelvic pain.  She underwent a pelvic ultrasound exam on 12/29/2021 which revealed a uterus measuring 6 x 3.3 x 3.2 cm.  Trace fluid noted within the endometrium with measurements given of the anterior wall measuring 1.3 mm and posterior wall measuring 1.1 mm.  Hyperechoic focal areas present extending from the endometrium into the myometrium measuring 1.4 x 0.6 x 1.3 cm.  Left ovary normal appearing, not appreciated.  Given fluid noted within the endometrium as well as endometrial thickening, endometrial biopsy was performed on 12/29/2021 and revealed acute endocervicitis and endocervical microglandular hyperplasia.  No dysplasia or malignancy noted.  No endometrial tissue identified.   On 01/16/2022, patient underwent total robotic hysterectomy with BSO for persistent cervical dysplasia.  Findings at the time of surgery were normal appearing uterus and bilateral adnexa.  Final pathology revealed FIGO grade 1 endometrioid adenocarcinoma arising within the background of complex atypical hyperplasia.  Myometrial invasion 17%, no lymphovascular invasion  identified.  Residual cervical dysplasia.  Benign fallopian tubes and ovaries. MMR IHC intact.  02/07/22: CT A/P with no evidence of metastatic disease.   Interval History: Doing well.  Denies any vaginal bleeding or discharge.  Denies any abdominal or pelvic pain.  Since her last visit with me, had an episode of left-sided aching in her mid abdomen, thinks this may have been related to a small flare of diverticulitis.  Increased her fibers and ultimately symptoms resolved.  Reports regular bowel and bladder function. . Is traveling tomorrow to [facility] to visit her daughter.  Past Medical/Surgical History: Past Medical History:  Diagnosis Date   Arthritis    both knees   Bipolar disorder ([name])    Colon polyp    Depression    Diverticulitis 2021   DVT (deep venous thrombosis) (HCC)    bunion surgery- post surgery- per patient. Was on blood thinners for short period. More than 10 years ago per pt on 01/10/22.   Dysplasia of cervix, low grade (CIN 1) 12/14/2011   LEEP procedures x 2   Genital warts    GERD (gastroesophageal reflux disease)    nexiumas needed   Herpes genitalia    History of hiatal hernia    Pre-diabetes    Patient takes Metformin and Ozempic to help with blood sugar and weight loss.   Urine incontinence     Past Surgical History:  Procedure Laterality Date   ARTHRODESIS FOOT WITH WEIL OSTEOTOMY Left 11/09/2020   Procedure: LEFT MEDIAL DISPLACEMENT CALCANEAL OSTEOTOMY, POSTERIOR TIBIAL TENDON DEBRIDEMENT AND TENODESIS, FLEXOR DIGITORUM LONGUS TRANSFER, SPRING LIGAMENT RECONSTRUCTION, POSSIBLE TENDO ACHILLES LENGTHENING;  Surgeon: Erle Crocker, MD;  Location: [facility];  Service: Orthopedics;  Laterality: Left;  LENGTH OF SURGERY: 2.5 HOURS   bunion removal Left    BUNIONECTOMY Right    COLONOSCOPY  12/02/2019   COLONOSCOPY W/ POLYPECTOMY     FLEXOR DIGITORUM LONGUS TRANSFER, SPRING LIGAMENT RECONSTRUCTION, POSSIBLE TENDO ACHILLES LENGTHENING (Left    11/09/2020   KNEE ARTHROSCOPY Left 2022   Menisus tear   LEFT MEDIAL DISPLACEMENT CALCANEAL OSTEOTOMY, POSTERIOR TIBIAL TENDON DEBRIDEMENT AND TENODESIS  11/09/2020   ROBOTIC ASSISTED TOTAL HYSTERECTOMY WITH BILATERAL SALPINGO OOPHERECTOMY Bilateral 01/16/2022   Procedure: XI ROBOTIC ASSISTED TOTAL HYSTERECTOMY WITH BILATERAL SALPINGO OOPHORECTOMY;  Surgeon: Aloha Gell, MD;  Location: [facility];  Service: Gynecology;  Laterality: Bilateral;  Requests 3hrs.   TONSILLECTOMY AND ADENOIDECTOMY     66 years old    Family History  Problem Relation Age of Onset   Sleep apnea Mother    Arthritis Mother    Alzheimer's disease Mother    Brain cancer Father    Alzheimer's disease Maternal Grandmother    Colon cancer Neg Hx    Breast cancer Neg Hx    Ovarian cancer Neg Hx    Endometrial cancer Neg Hx    Pancreatic cancer Neg Hx    Prostate cancer Neg Hx     Social History   Socioeconomic History   Marital status: Divorced    Spouse name: Not on file   Number of children: Not on file   Years of education: Not on file   Highest education level: Not on file  Occupational History   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)  Tobacco Use   Smoking status: Never   Smokeless tobacco: Never  Vaping Use   Vaping Use: Never used  Substance and Sexual Activity   Alcohol use: Yes    Alcohol/week: 1.0 standard drink of alcohol    Types: 1 Glasses of wine per week    Comment: socially - infrequent   Drug use: Never   Sexual activity: Not Currently    Birth control/protection: Post-menopausal  Other Topics Concern   Not on file  Social History Narrative   Not on file   Social Determinants of Health   Financial Resource Strain: Not on file  Food Insecurity: Not on file  Transportation Needs: Not on file  Physical Activity: Not on file  Stress: Not on file  Social Connections: Not on file    Current Medications:  Current Outpatient Medications:     fluvoxaMINE (LUVOX) 50 MG tablet, Take 1 tablet (50 mg total) by mouth 2 (two) times daily., Disp: , Rfl: 1   lamoTRIgine (LAMICTAL) 200 MG tablet, Take 200 mg by mouth daily., Disp: , Rfl: 1   metFORMIN (GLUCOPHAGE-XR) 500 MG 24 hr tablet, Take 500 mg by mouth 2 (two) times daily., Disp: , Rfl: 2   Multiple Vitamin (MULTIVITAMIN WITH MINERALS) TABS tablet, Take 1 tablet by mouth daily., Disp: , Rfl:    OLANZapine (ZYPREXA) 2.5 MG tablet, Take 2.5 mg by mouth at bedtime., Disp: , Rfl: 0  Review of Systems: Denies appetite changes, fevers, chills, fatigue, unexplained weight changes. Denies hearing loss, neck lumps or masses, mouth sores, ringing in ears or voice changes. Denies cough or wheezing.  Denies shortness of breath. Denies chest pain or palpitations. Denies leg swelling. Denies abdominal distention, pain, blood in stools, constipation, diarrhea, nausea, vomiting, or early satiety. Denies pain with intercourse, dysuria, frequency, hematuria or incontinence. Denies hot flashes, pelvic pain, vaginal bleeding or vaginal discharge.   Denies joint pain, back pain  or muscle pain/cramps. Denies itching, rash, or wounds. Denies dizziness, headaches, numbness or seizures. Denies swollen lymph nodes or glands, denies easy bruising or bleeding. Denies anxiety, depression, confusion, or decreased concentration.  Physical Exam: BP 127/66 (BP Location: Left Arm, Patient Position: Sitting)   Pulse 68   Temp (!) 97.5 F (36.4 C) (Oral)   Resp 20   Ht 5' 5"  (1.651 m)   Wt 168 lb (76.2 kg)   BMI 27.96 kg/m  General: Alert, oriented, no acute distress.  HEENT: Normocephalic, atraumatic. Sclera anicteric.  Chest: Clear to auscultation bilaterally. No wheezes, rhonchi, or rales. Cardiovascular: Regular rate and rhythm, no murmurs, rubs, or gallops.  Abdomen: Normoactive bowel sounds. Soft, nondistended, nontender to palpation. No masses or hepatosplenomegaly appreciated.  Well-healed  incisions. Extremities: Grossly normal range of motion. Warm, well perfused. No edema bilaterally.  GU: Normal appearing external genitalia without erythema, excoriation, or lesions.  Speculum exam reveals moderately atrophic vaginal mucosa, somewhat difficult to see the vaginal apex.  Bimanual exam reveals narrowing of the apex of the vagina and allows for insertion of 1 finger to the apex.  No nodularity or masses appreciated.  Rectovaginal exam  confirms findings.  Laboratory & Radiologic Studies: None new  Assessment & Plan: Summer Craig is a 66 y.o. woman with long history of intermittent cervical dysplasia and persistent HPV infection who underwent robotic hysterectomy with an incidental diagnosis of low-grade endometrioid endometrial adenocarcinoma on final pathology.  Patient is overall doing well and is NED on exam today.  From an endometrial cancer standpoint, routine pap tests are not indicated. Given history of cervical dysplasia, I encouraged her to discuss with OB/GYN any additional surveillance that she needs given her history of HPV as well as Pap smear history.  I only have her more recent Pap smear history record.  I encouraged her to bring this up with her OB/GYN when she is seen for follow-up next March.  In the setting of her low risk endometrial cancer, we will continue with surveillance visits every 6 months per NCCN recommendations.  We will alternate these visits between my office and her OB/GYN.  Her next visit will be with her OB/GYN in March.  I have asked her to call over the summer next year to schedule a visit to see me in October 2024.  We reviewed signs and symptoms that would be concerning for cancer recurrence and I stressed the importance of calling if she develops any of these between visits.  22 minutes of total time was spent for this patient encounter, including preparation, face-to-face counseling with the patient and coordination of care, and  documentation of the encounter.  Jeral Pinch, MD  Division of Gynecologic Oncology  Department of Obstetrics and Gynecology  [facility]

## 2022-12-12 ENCOUNTER — Encounter: Payer: Self-pay | Admitting: Family Medicine

## 2022-12-12 ENCOUNTER — Ambulatory Visit (INDEPENDENT_AMBULATORY_CARE_PROVIDER_SITE_OTHER): Payer: Medicare Other | Admitting: Family Medicine

## 2022-12-12 VITALS — BP 100/62 | HR 65 | Temp 98.3°F | Ht 64.5 in | Wt 160.5 lb

## 2022-12-12 DIAGNOSIS — Z23 Encounter for immunization: Secondary | ICD-10-CM

## 2022-12-12 DIAGNOSIS — Z0000 Encounter for general adult medical examination without abnormal findings: Secondary | ICD-10-CM | POA: Diagnosis not present

## 2022-12-12 DIAGNOSIS — R7301 Impaired fasting glucose: Secondary | ICD-10-CM | POA: Diagnosis not present

## 2022-12-12 DIAGNOSIS — Z780 Asymptomatic menopausal state: Secondary | ICD-10-CM | POA: Diagnosis not present

## 2022-12-12 DIAGNOSIS — Z13220 Encounter for screening for lipoid disorders: Secondary | ICD-10-CM | POA: Diagnosis not present

## 2022-12-12 LAB — LIPID PANEL
Cholesterol: 249 mg/dL — ABNORMAL HIGH (ref 0–200)
HDL: 73.4 mg/dL (ref >39.00)
LDL Cholesterol: 152 mg/dL — ABNORMAL HIGH (ref 0–99)
NonHDL: 175.6
Total CHOL/HDL Ratio: 3
Triglycerides: 118 mg/dL (ref 0.0–149.0)
VLDL: 23.6 mg/dL (ref 0.0–40.0)

## 2022-12-12 LAB — COMPREHENSIVE METABOLIC PANEL
ALT: 17 U/L (ref 0–35)
AST: 20 U/L (ref 0–37)
Albumin: 4.5 g/dL (ref 3.5–5.2)
Alkaline Phosphatase: 68 U/L (ref 39–117)
BUN: 14 mg/dL (ref 6–23)
CO2: 27 mEq/L (ref 19–32)
Calcium: 9.6 mg/dL (ref 8.4–10.5)
Chloride: 103 mEq/L (ref 96–112)
Creatinine, Ser: 0.71 mg/dL (ref 0.40–1.20)
GFR: 89.48 mL/min (ref >60.00)
Glucose, Bld: 87 mg/dL (ref 70–99)
Potassium: 4.1 mEq/L (ref 3.5–5.1)
Sodium: 138 mEq/L (ref 135–145)
Total Bilirubin: 0.4 mg/dL (ref 0.2–1.2)
Total Protein: 7.5 g/dL (ref 6.0–8.3)

## 2022-12-12 LAB — HEMOGLOBIN A1C: Hgb A1c MFr Bld: 5.7 % (ref 4.6–6.5)

## 2022-12-12 NOTE — Patient Instructions (Signed)

## 2022-12-12 NOTE — Progress Notes (Signed)
Complete physical exam  Patient: Summer Craig   DOB: August 16, 1956   67 y.o. Female  MRN: [id number]  Subjective:    Chief Complaint  Patient presents with   Annual Exam    Summer Craig is a 67 y.o. female who presents today for a complete physical exam. She reports consuming a general diet.  Walks the dog daily  She generally feels well. She reports sleeping well. She does not have additional problems to discuss today.    Most recent fall risk assessment:    07/25/2018    9:24 AM  [place] in the past year? No     Most recent depression screenings:    12/12/2022   10:54 AM 08/08/2022    4:08 PM  PHQ 2/9 Scores  PHQ - 2 Score 0 0  PHQ- 9 Score 0 0    Vision:Within last year and Dental: No current dental problems and Receives regular dental care  Patient Active Problem List   Diagnosis Date Noted   Encounter for counseling 05/01/2022   Endometrial cancer ([place]) 01/30/2022   Cervical dysplasia 01/16/2022   S/P hysterectomy with oophorectomy 01/16/2022   Impaired fasting glucose 01/11/2022   Overweight (BMI 25.0-29.9) 01/11/2022   Posterior tibial tendon dysfunction (PTTD) of left lower extremity 11/09/2020   Bipolar disorder ([place]) 12/12/2019   Hiatal hernia 12/12/2019   Diverticulosis 12/12/2019   Posterior tibial tendonitis of right leg 07/24/2018      Patient Care Team: Farrel Conners, MD as PCP - General (Family Medicine) Aloha Gell, MD as Consulting Physician (Obstetrics and Gynecology)   Outpatient Medications Prior to Visit  Medication Sig   fluvoxaMINE (LUVOX) 50 MG tablet Take 1 tablet (50 mg total) by mouth 2 (two) times daily.   lamoTRIgine (LAMICTAL) 200 MG tablet Take 200 mg by mouth daily.   metFORMIN (GLUCOPHAGE-XR) 500 MG 24 hr tablet Take 500 mg by mouth 2 (two) times daily.   Multiple Vitamin (MULTIVITAMIN WITH MINERALS) TABS tablet Take 1 tablet by mouth daily.   OLANZapine (ZYPREXA) 2.5 MG tablet Take 2.5 mg by mouth  at bedtime.   No facility-administered medications prior to visit.    Review of Systems  HENT:  Negative for hearing loss.   Eyes:  Negative for blurred vision.  Respiratory:  Negative for shortness of breath.   Cardiovascular:  Negative for chest pain.  Gastrointestinal: Negative.   Genitourinary: Negative.   Musculoskeletal:  Negative for back pain.  Neurological:  Negative for headaches.  Psychiatric/Behavioral:  Negative for depression.           Objective:     BP 100/62 (BP Location: Left Arm, Patient Position: Sitting, Cuff Size: Large)   Pulse 65   Temp 98.3 F (36.8 C) (Oral)   Ht 5' 4.5" (1.638 m)   Wt 160 lb 8 oz (72.8 kg)   SpO2 98%   BMI 27.12 kg/m    Physical Exam Vitals reviewed.  Constitutional:      Appearance: Normal appearance. She is well-groomed and normal weight.  HENT:     Right Ear: Tympanic membrane and ear canal normal.     Left Ear: Tympanic membrane and ear canal normal.     Mouth/Throat:     Mouth: Mucous membranes are moist.     Pharynx: No posterior oropharyngeal erythema.  Eyes:     Conjunctiva/sclera: Conjunctivae normal.  Neck:     Thyroid: No thyromegaly.  Cardiovascular:     Rate  and Rhythm: Normal rate and regular rhythm.     Pulses: Normal pulses.     Heart sounds: S1 normal and S2 normal.  Pulmonary:     Effort: Pulmonary effort is normal.     Breath sounds: Normal breath sounds and air entry.  Abdominal:     General: Bowel sounds are normal.  Musculoskeletal:     Right lower leg: No edema.     Left lower leg: No edema.  Lymphadenopathy:     Cervical: No cervical adenopathy.  Neurological:     Mental Status: She is alert and oriented to person, place, and time. Mental status is at baseline.     Gait: Gait is intact.  Psychiatric:        Mood and Affect: Mood and affect normal.        Speech: Speech normal.        Behavior: Behavior normal.        Judgment: Judgment normal.      No results found for any  visits on 12/12/22.     Assessment & Plan:    Routine Health Maintenance and Physical Exam  Immunization History  Administered Date(s) Administered   Fluad Quad(high Dose 65+) 12/12/2022   Influenza-Unspecified 10/13/2021   Moderna Sars-Covid-2 Vaccination 10/19/2020   PFIZER(Purple Top)SARS-COV-2 Vaccination 02/12/2020, 03/08/2020   PNEUMOCOCCAL CONJUGATE-20 12/12/2022   Pneumococcal Conjugate-13 10/06/2013   Tdap 09/02/2012   Zoster Recombinat (Shingrix) 12/12/2019, 03/12/2020    Health Maintenance  Topic Date Due   Medicare Annual Wellness (AWV)  Never done   COVID-19 Vaccine (4 - 2023-24 season) 07/14/2022   DTaP/Tdap/Td (2 - Td or Tdap) 09/02/2022   DEXA SCAN  Never done   HIV Screening  08/09/2023 (Originally 12/08/1970)   PAP SMEAR-Modifier  01/12/2024   MAMMOGRAM  02/01/2024   COLONOSCOPY (Pts 45-3yr Insurance coverage will need to be confirmed)  12/01/2029   Pneumonia Vaccine 65 Years old  Completed   INFLUENZA VACCINE  Completed   Hepatitis C Screening  Completed   Zoster Vaccines- Shingrix  Completed   HPV VACCINES  Aged Out    Discussed health benefits of physical activity, and encouraged her to engage in regular exercise appropriate for her age and condition.  Problem List Items Addressed This Visit       Unprioritized   Impaired fasting glucose   Relevant Orders   Hemoglobin A1c   Other Visit Diagnoses     Postmenopausal state    -  Primary   Relevant Orders   DG Bone Density   Immunization due       Relevant Orders   Flu Vaccine QUAD High Dose(Fluad) (Completed)   Pneumococcal conjugate vaccine 20-valent (Prevnar 20) (Completed)   Routine adult health maintenance       Relevant Orders   CMP   Lipid screening       Relevant Orders   Lipid Panel  Normal physical exam findings today, pt advised to get her Tdap at any pharmacy in the area. Prevnar and flu given today. Pt needs her annual bloodwork for surveillance.     Return in about 1  year (around 12/13/2023) for annual physical exam.     BFarrel Conners MD

## 2022-12-19 ENCOUNTER — Ambulatory Visit (INDEPENDENT_AMBULATORY_CARE_PROVIDER_SITE_OTHER)
Admission: RE | Admit: 2022-12-19 | Discharge: 2022-12-19 | Disposition: A | Discharge status: Home / Self Care | Payer: Medicare Other | Source: Ambulatory Visit | Attending: Family Medicine | Admitting: Family Medicine

## 2022-12-19 DIAGNOSIS — Z780 Asymptomatic menopausal state: Secondary | ICD-10-CM

## 2022-12-28 DIAGNOSIS — F3175 Bipolar disorder, in partial remission, most recent episode depressed: Secondary | ICD-10-CM | POA: Diagnosis not present

## 2023-01-01 ENCOUNTER — Encounter: Payer: Self-pay | Admitting: Family Medicine

## 2023-01-01 DIAGNOSIS — R7301 Impaired fasting glucose: Secondary | ICD-10-CM

## 2023-01-01 MED ORDER — METFORMIN HCL ER 500 MG PO TB24: 500.0000 mg | ORAL_TABLET | Freq: Two times a day (BID) | ORAL | 2 refills | Status: DC

## 2023-02-01 ENCOUNTER — Telehealth: Payer: Self-pay | Admitting: Family Medicine

## 2023-02-01 NOTE — Telephone Encounter (Signed)
Pt is going to call back to schedule a Welcome to medicare visit--needs to be done before 11/14/23.

## 2023-02-08 DIAGNOSIS — Z1231 Encounter for screening mammogram for malignant neoplasm of breast: Secondary | ICD-10-CM | POA: Diagnosis not present

## 2023-02-24 ENCOUNTER — Ambulatory Visit (HOSPITAL_COMMUNITY): Payer: Medicare Other

## 2023-02-25 ENCOUNTER — Ambulatory Visit (HOSPITAL_COMMUNITY): Payer: Medicare Other

## 2023-03-20 DIAGNOSIS — B977 Papillomavirus as the cause of diseases classified elsewhere: Secondary | ICD-10-CM | POA: Diagnosis not present

## 2023-03-20 DIAGNOSIS — N393 Stress incontinence (female) (male): Secondary | ICD-10-CM | POA: Diagnosis not present

## 2023-03-20 DIAGNOSIS — Z01411 Encounter for gynecological examination (general) (routine) with abnormal findings: Secondary | ICD-10-CM | POA: Diagnosis not present

## 2023-03-27 ENCOUNTER — Telehealth: Payer: Self-pay | Admitting: *Deleted

## 2023-03-27 NOTE — Telephone Encounter (Signed)
LMOM for the patient to call the office back. Patient needs to be scheduled for a follow up appt with Dr Pricilla Holm in Oct

## 2023-03-28 NOTE — Telephone Encounter (Signed)
Patient called back and Summer Craig CMA scheduled appt

## 2023-05-25 DIAGNOSIS — H903 Sensorineural hearing loss, bilateral: Secondary | ICD-10-CM | POA: Diagnosis not present

## 2023-05-25 DIAGNOSIS — H6122 Impacted cerumen, left ear: Secondary | ICD-10-CM | POA: Diagnosis not present

## 2023-05-25 DIAGNOSIS — H608X3 Other otitis externa, bilateral: Secondary | ICD-10-CM | POA: Diagnosis not present

## 2023-06-27 DIAGNOSIS — F3175 Bipolar disorder, in partial remission, most recent episode depressed: Secondary | ICD-10-CM | POA: Diagnosis not present

## 2023-07-02 ENCOUNTER — Telehealth: Payer: Self-pay | Admitting: Family Medicine

## 2023-07-02 NOTE — Telephone Encounter (Signed)
Pt has had "very bad" acid reflux, for some time now.  Pt is now experiencing a swollen lump in the back of her throat and she is having trouble swallowing.  Pt is requesting a Gastroenterology referral to see someone, as soon as possible.

## 2023-07-02 NOTE — Telephone Encounter (Signed)
Spoke with the patient, informed her a visit is needed for evaluation and if the provider feels a referral is needed, this would be placed during the visit.  Appt was scheduled with PCP on 8/21.

## 2023-07-04 ENCOUNTER — Ambulatory Visit: Payer: Medicare Other | Admitting: Family Medicine

## 2023-07-25 ENCOUNTER — Encounter: Payer: Self-pay | Admitting: Obstetrics

## 2023-08-24 DIAGNOSIS — H524 Presbyopia: Secondary | ICD-10-CM | POA: Diagnosis not present

## 2023-08-28 DIAGNOSIS — K219 Gastro-esophageal reflux disease without esophagitis: Secondary | ICD-10-CM | POA: Diagnosis not present

## 2023-08-28 DIAGNOSIS — R1319 Other dysphagia: Secondary | ICD-10-CM | POA: Diagnosis not present

## 2023-08-28 DIAGNOSIS — K5790 Diverticulosis of intestine, part unspecified, without perforation or abscess without bleeding: Secondary | ICD-10-CM | POA: Diagnosis not present

## 2023-08-29 ENCOUNTER — Encounter: Payer: Self-pay | Admitting: Gynecologic Oncology

## 2023-08-31 ENCOUNTER — Inpatient Hospital Stay: Payer: Medicare Other | Attending: Gynecologic Oncology | Admitting: Gynecologic Oncology

## 2023-08-31 ENCOUNTER — Encounter: Payer: Self-pay | Admitting: Gynecologic Oncology

## 2023-08-31 VITALS — BP 128/83 | HR 67 | Temp 98.0°F | Resp 19 | Ht 64.0 in | Wt 161.4 lb

## 2023-08-31 DIAGNOSIS — N393 Stress incontinence (female) (male): Secondary | ICD-10-CM | POA: Diagnosis not present

## 2023-08-31 DIAGNOSIS — Z90711 Acquired absence of uterus with remaining cervical stump: Secondary | ICD-10-CM | POA: Insufficient documentation

## 2023-08-31 DIAGNOSIS — Z8542 Personal history of malignant neoplasm of other parts of uterus: Secondary | ICD-10-CM | POA: Insufficient documentation

## 2023-08-31 DIAGNOSIS — C541 Malignant neoplasm of endometrium: Secondary | ICD-10-CM

## 2023-08-31 NOTE — Progress Notes (Signed)
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 More recent history: 11/2017-normal Pap smear, high risk HPV negative 01/2020-normal Pap smear, high risk HPV positive.  Colposcopy showed stenotic cervix, ECC with L SIL 02/2021-normal Pap, high risk HPV positive, 18/45 positive.  Colposcopy inadequate with benign ECC and no visible lesions on cervix.  Cervix flush with vaginal wall.   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  She was not having any vaginal bleeding, discharge, or pelvic pain.  She underwent a pelvic ultrasound exam on 12/29/2021 which revealed a uterus measuring 6 x 3.3 x 3.2 cm.  Trace fluid noted within the endometrium with measurements given of the anterior wall measuring 1.3 mm and posterior wall measuring 1.1 mm.  Hyperechoic focal areas present extending from the endometrium into the myometrium measuring 1.4 x 0.6 x 1.3 cm.  Left ovary normal appearing, not appreciated.  Given fluid noted within the endometrium as well as endometrial thickening, endometrial biopsy was performed on 12/29/2021 and revealed acute endocervicitis and endocervical microglandular hyperplasia.  No dysplasia or malignancy noted.  No endometrial tissue identified.   On 01/16/2022, patient underwent total robotic hysterectomy with BSO for persistent cervical dysplasia.  Findings at the time of surgery were normal appearing uterus and bilateral adnexa.  Final pathology revealed FIGO grade 1 endometrioid adenocarcinoma arising within the background of complex atypical hyperplasia.  Myometrial invasion 17%, no lymphovascular invasion  identified.  Residual cervical dysplasia.  Benign fallopian tubes and ovaries. MMR IHC intact.   02/07/22: CT A/P with no evidence of metastatic disease.   Interval History: Doing well.  Denies any vaginal bleeding or discharge.  Reports baseline bowel function.  Has noticed worsening of her stress urinary incontinence, has become more bothersome.  Denies any abdominal or pelvic pain.  Past Medical/Surgical History: Past Medical History:  Diagnosis Date   Arthritis    both knees   Bipolar disorder (HCC)    Colon polyp    Depression    Diverticulitis 2021   DVT (deep venous thrombosis) (HCC)    bunion surgery- post surgery- per patient. Was on blood thinners for short period. More than 10 years ago per pt on 01/10/22.   Dysplasia of cervix, low grade (CIN 1) 12/14/2011   LEEP procedures x 2   Genital warts    GERD (gastroesophageal reflux disease)    nexiumas needed   Herpes genitalia    History of hiatal hernia    Pre-diabetes    Patient takes Metformin and Ozempic to help with blood sugar and weight loss.   Urine incontinence     Past Surgical History:  Procedure Laterality Date   ARTHRODESIS FOOT WITH WEIL OSTEOTOMY Left 11/09/2020   Procedure: LEFT MEDIAL DISPLACEMENT CALCANEAL OSTEOTOMY, POSTERIOR TIBIAL TENDON DEBRIDEMENT AND TENODESIS, FLEXOR DIGITORUM LONGUS TRANSFER, SPRING LIGAMENT RECONSTRUCTION, POSSIBLE TENDO ACHILLES LENGTHENING;  Surgeon: Terance Hart, MD;  Location: [facility];  Service: Orthopedics;  Laterality: Left;  LENGTH OF SURGERY: 2.5 HOURS   bunion removal Left    BUNIONECTOMY Right    COLONOSCOPY  12/02/2019   COLONOSCOPY W/ POLYPECTOMY     FLEXOR DIGITORUM LONGUS TRANSFER, SPRING  LIGAMENT RECONSTRUCTION, POSSIBLE TENDO ACHILLES LENGTHENING (Left   11/09/2020   KNEE ARTHROSCOPY Left 2022   Menisus tear   LEFT MEDIAL DISPLACEMENT CALCANEAL OSTEOTOMY, POSTERIOR TIBIAL TENDON DEBRIDEMENT AND TENODESIS  11/09/2020   ROBOTIC ASSISTED TOTAL HYSTERECTOMY  WITH BILATERAL SALPINGO OOPHERECTOMY Bilateral 01/16/2022   Procedure: XI ROBOTIC ASSISTED TOTAL HYSTERECTOMY WITH BILATERAL SALPINGO OOPHORECTOMY;  Surgeon: Noland Fordyce, MD;  Location: [facility];  Service: Gynecology;  Laterality: Bilateral;  Requests 3hrs.   TONSILLECTOMY AND ADENOIDECTOMY     67 years old    Family History  Problem Relation Age of Onset   Sleep apnea Mother    Arthritis Mother    Alzheimer's disease Mother    Brain cancer Father    Alzheimer's disease Maternal Grandmother    Colon cancer Neg Hx    Breast cancer Neg Hx    Ovarian cancer Neg Hx    Endometrial cancer Neg Hx    Pancreatic cancer Neg Hx    Prostate cancer Neg Hx     Social History   Socioeconomic History   Marital status: Divorced    Spouse name: Not on file   Number of children: Not on file   Years of education: Not on file   Highest education level: Not on file  Occupational History   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)  Tobacco Use   Smoking status: Never   Smokeless tobacco: Never  Vaping Use   Vaping status: Never Used  Substance and Sexual Activity   Alcohol use: Yes    Alcohol/week: 1.0 standard drink of alcohol    Types: 1 Glasses of wine per week    Comment: socially - infrequent   Drug use: Never   Sexual activity: Not Currently    Birth control/protection: Post-menopausal  Other Topics Concern   Not on file  Social History Narrative   Not on file   Social Determinants of Health   Financial Resource Strain: Not on file  Food Insecurity: Not on file  Transportation Needs: Not on file  Physical Activity: Not on file  Stress: Not on file  Social Connections: Not on file    Current Medications:  Current Outpatient Medications:    Biotin 5000 MCG TABS, Take by mouth., Disp: , Rfl:    esomeprazole (NEXIUM) 20 MG packet, Take 20 mg by mouth daily before breakfast., Disp: , Rfl:    fluvoxaMINE (LUVOX) 50 MG tablet, Take 1 tablet (50 mg  total) by mouth 2 (two) times daily., Disp: , Rfl: 1   lamoTRIgine (LAMICTAL) 200 MG tablet, Take 200 mg by mouth daily., Disp: , Rfl: 1   metFORMIN (GLUCOPHAGE-XR) 500 MG 24 hr tablet, Take 1 tablet (500 mg total) by mouth 2 (two) times daily., Disp: 180 tablet, Rfl: 2   Multiple Vitamin (MULTIVITAMIN WITH MINERALS) TABS tablet, Take 1 tablet by mouth daily., Disp: , Rfl:    OLANZapine (ZYPREXA) 2.5 MG tablet, Take 2.5 mg by mouth at bedtime., Disp: , Rfl: 0   Wheat Dextrin (BENEFIBER) CHEW, Chew by mouth., Disp: , Rfl:   Review of Systems: Denies appetite changes, fevers, chills, fatigue, unexplained weight changes. Denies hearing loss, neck lumps or masses, mouth sores, ringing in ears or voice changes. Denies cough or wheezing.  Denies shortness of breath. Denies chest pain or palpitations. Denies leg swelling. Denies abdominal distention, pain, blood in stools, constipation, diarrhea, nausea, vomiting, or early satiety. Denies pain with intercourse, dysuria, frequency, hematuria or incontinence. Denies hot flashes,  pelvic pain, vaginal bleeding or vaginal discharge.   Denies joint pain, back pain or muscle pain/cramps. Denies itching, rash, or wounds. Denies dizziness, headaches, numbness or seizures. Denies swollen lymph nodes or glands, denies easy bruising or bleeding. Denies anxiety, depression, confusion, or decreased concentration.  Physical Exam: BP 128/83 (Patient Position: Sitting)   Pulse 67   Temp 98 F (36.7 C) (Oral)   Resp 19   Ht 5\' 4"  (1.626 m)   Wt 161 lb 6.4 oz (73.2 kg)   SpO2 99%   BMI 27.70 kg/m  General: Alert, oriented, no acute distress.  HEENT: Normocephalic, atraumatic. Sclera anicteric.  Chest: Clear to auscultation bilaterally. No wheezes, rhonchi, or rales. Cardiovascular: Regular rate and rhythm, no murmurs, rubs, or gallops.  Abdomen: Normoactive bowel sounds. Soft, nondistended, nontender to palpation. No masses or hepatosplenomegaly  appreciated.  Well-healed incisions. Extremities: Grossly normal range of motion. Warm, well perfused. No edema bilaterally.  GU: Normal appearing external genitalia without erythema, excoriation, or lesions.  Speculum exam reveals moderately atrophic vaginal mucosa.  Bimanual exam reveals minimal narrowing of the apex of the vagina.  No nodularity or masses appreciated.  Rectovaginal exam confirms findings.  Laboratory & Radiologic Studies: 03/2023: NIML pap.   Assessment & Plan: Summer Craig is a 67 y.o. woman with long history of intermittent cervical dysplasia and persistent HPV infection who underwent robotic hysterectomy with an incidental diagnosis of stage IA2 low-grade endometrioid endometrial adenocarcinoma on final pathology. MMRp/MSS.   Patient is overall doing well and is NED on exam today.   From an endometrial cancer standpoint, routine pap tests are not indicated. Given history of cervical dysplasia, the patient is following with her OB/GYN for this history, had Pap test earlier this year that was negative.  I would recommend cotesting for her given high-grade dysplasia history Paps are indicated.   In the setting of her low risk endometrial cancer, we will continue with surveillance visits every 6 months per NCCN recommendations.  We will alternate these visits between my office and her OB/GYN.  Her next visit will be with her OB/GYN in March.  I have asked her to call over the summer next year to schedule a visit to see me in October 2025.  We reviewed signs and symptoms that would be concerning for cancer recurrence and I stressed the importance of calling if she develops any of these between visits.  Given worsening and bothersome stress urinary incontinence, discussed possible treatment options.  The patient has tried pelvic floor physical therapy without any improvement.  Recommended consultation to urogynecology to discuss other options including pessary.  22 minutes of  total time was spent for this patient encounter, including preparation, face-to-face counseling with the patient and coordination of care, and documentation of the encounter.  Eugene Garnet, MD  Division of Gynecologic Oncology  Department of Obstetrics and Gynecology  [facility] [place]

## 2023-08-31 NOTE — Patient Instructions (Signed)
It was good to see you today.  I do not see or feel any evidence of cancer recurrence on your exam.  Please make sure to schedule follow-up with your OB/GYN in 6 months.  Please call my office next summer to schedule a visit to see me in 12 months.  As always, if you develop any new and concerning symptoms before your next visit, please call to see me sooner.

## 2023-09-03 ENCOUNTER — Encounter: Payer: Self-pay | Admitting: Obstetrics

## 2023-09-11 DIAGNOSIS — F3175 Bipolar disorder, in partial remission, most recent episode depressed: Secondary | ICD-10-CM | POA: Diagnosis not present

## 2023-09-19 DIAGNOSIS — K2100 Gastro-esophageal reflux disease with esophagitis, without bleeding: Secondary | ICD-10-CM | POA: Diagnosis not present

## 2023-09-19 DIAGNOSIS — K222 Esophageal obstruction: Secondary | ICD-10-CM | POA: Diagnosis not present

## 2023-09-19 DIAGNOSIS — K219 Gastro-esophageal reflux disease without esophagitis: Secondary | ICD-10-CM | POA: Diagnosis not present

## 2023-09-19 DIAGNOSIS — K2930 Chronic superficial gastritis without bleeding: Secondary | ICD-10-CM | POA: Diagnosis not present

## 2023-09-19 DIAGNOSIS — R1310 Dysphagia, unspecified: Secondary | ICD-10-CM | POA: Diagnosis not present

## 2023-09-19 DIAGNOSIS — B9681 Helicobacter pylori [H. pylori] as the cause of diseases classified elsewhere: Secondary | ICD-10-CM | POA: Diagnosis not present

## 2023-09-26 DIAGNOSIS — B9681 Helicobacter pylori [H. pylori] as the cause of diseases classified elsewhere: Secondary | ICD-10-CM | POA: Diagnosis not present

## 2023-09-26 DIAGNOSIS — K2930 Chronic superficial gastritis without bleeding: Secondary | ICD-10-CM | POA: Diagnosis not present

## 2023-09-26 DIAGNOSIS — K219 Gastro-esophageal reflux disease without esophagitis: Secondary | ICD-10-CM | POA: Diagnosis not present

## 2023-10-23 DIAGNOSIS — Z8619 Personal history of other infectious and parasitic diseases: Secondary | ICD-10-CM | POA: Diagnosis not present

## 2023-10-23 DIAGNOSIS — N952 Postmenopausal atrophic vaginitis: Secondary | ICD-10-CM | POA: Diagnosis not present

## 2023-10-23 DIAGNOSIS — A609 Anogenital herpesviral infection, unspecified: Secondary | ICD-10-CM | POA: Diagnosis not present

## 2023-11-21 ENCOUNTER — Telehealth: Payer: Self-pay

## 2023-11-21 NOTE — Telephone Encounter (Signed)
 Copied from CRM [id number]. Topic: General - Other >> Nov 21, 2023  3:19 PM Jon Gills C wrote: Reason for CRM: Patient called in regarding message from Zella Ball , wants to call back to explain

## 2023-12-02 ENCOUNTER — Encounter (HOSPITAL_COMMUNITY): Payer: Self-pay

## 2023-12-02 ENCOUNTER — Ambulatory Visit (INDEPENDENT_AMBULATORY_CARE_PROVIDER_SITE_OTHER): Payer: Medicare Other

## 2023-12-02 ENCOUNTER — Ambulatory Visit (HOSPITAL_COMMUNITY)
Admission: EM | Admit: 2023-12-02 | Discharge: 2023-12-02 | Disposition: A | Discharge status: Home / Self Care | Payer: Medicare Other

## 2023-12-02 DIAGNOSIS — R051 Acute cough: Secondary | ICD-10-CM

## 2023-12-02 DIAGNOSIS — J209 Acute bronchitis, unspecified: Secondary | ICD-10-CM | POA: Diagnosis not present

## 2023-12-02 DIAGNOSIS — R059 Cough, unspecified: Secondary | ICD-10-CM | POA: Diagnosis not present

## 2023-12-02 MED ORDER — ALBUTEROL SULFATE HFA 108 (90 BASE) MCG/ACT IN AERS
INHALATION_SPRAY | RESPIRATORY_TRACT | Status: AC
  Filled 2023-12-02: qty 6.7

## 2023-12-02 MED ORDER — PREDNISONE 20 MG PO TABS: 40.0000 mg | ORAL_TABLET | Freq: Every day | ORAL | 0 refills | Status: AC

## 2023-12-02 MED ORDER — AEROCHAMBER PLUS FLO-VU MISC
1.0000 | Freq: Once | Status: AC
Start: 2023-12-02 — End: 2023-12-02
  Administered 2023-12-02: 1

## 2023-12-02 MED ORDER — AEROCHAMBER PLUS FLO-VU LARGE MISC
Status: AC
  Filled 2023-12-02: qty 1

## 2023-12-02 MED ORDER — ALBUTEROL SULFATE HFA 108 (90 BASE) MCG/ACT IN AERS
2.0000 | INHALATION_SPRAY | Freq: Once | RESPIRATORY_TRACT | Status: AC
Start: 2023-12-02 — End: 2023-12-02
  Administered 2023-12-02: 2 via RESPIRATORY_TRACT

## 2023-12-02 MED ORDER — AMOXICILLIN 500 MG PO CAPS: 1000.0000 mg | ORAL_CAPSULE | Freq: Three times a day (TID) | ORAL | 0 refills | Status: AC

## 2023-12-02 NOTE — ED Provider Notes (Signed)
[facility]    CSN: [id number] Arrival date & time: 12/02/23  1258      History   Chief Complaint Chief Complaint  Patient presents with   Cough    HPI Summer Craig is a 68 y.o. female.   On Thursday she tested positive for influenza A with an at home test.  She reports that she is still feeling congested, and has started coughing up blood tinged mucus.  Reports that she was febrile yesterday and then Tmax this morning was 99.6.  She is concerned that she has bronchitis or pneumonia.  Denies nausea and vomiting.  Her cough has been worse at night.   Cough   Past Medical History:  Diagnosis Date   Arthritis    both knees   Bipolar disorder (HCC)    Colon polyp    Depression    Diverticulitis 2021   DVT (deep venous thrombosis) (HCC)    bunion surgery- post surgery- per patient. Was on blood thinners for short period. More than 10 years ago per pt on 01/10/22.   Dysplasia of cervix, low grade (CIN 1) 12/14/2011   LEEP procedures x 2   Genital warts    GERD (gastroesophageal reflux disease)    nexiumas needed   Herpes genitalia    History of hiatal hernia    Pre-diabetes    Patient takes Metformin and Ozempic to help with blood sugar and weight loss.   Urine incontinence     Patient Active Problem List   Diagnosis Date Noted   Encounter for counseling 05/01/2022   Endometrial cancer (HCC) 01/30/2022   Cervical dysplasia 01/16/2022   S/P hysterectomy with oophorectomy 01/16/2022   Impaired fasting glucose 01/11/2022   Overweight (BMI 25.0-29.9) 01/11/2022   Posterior tibial tendon dysfunction (PTTD) of left lower extremity 11/09/2020   Bipolar disorder (HCC) 12/12/2019   Hiatal hernia 12/12/2019   Diverticulosis 12/12/2019   Posterior tibial tendonitis of right leg 07/24/2018    Past Surgical History:  Procedure Laterality Date   ARTHRODESIS FOOT WITH WEIL OSTEOTOMY Left 11/09/2020   Procedure: LEFT MEDIAL DISPLACEMENT CALCANEAL OSTEOTOMY,  POSTERIOR TIBIAL TENDON DEBRIDEMENT AND TENODESIS, FLEXOR DIGITORUM LONGUS TRANSFER, SPRING LIGAMENT RECONSTRUCTION, POSSIBLE TENDO ACHILLES LENGTHENING;  Surgeon: Terance Hart, MD;  Location: [facility];  Service: Orthopedics;  Laterality: Left;  LENGTH OF SURGERY: 2.5 HOURS   bunion removal Left    BUNIONECTOMY Right    COLONOSCOPY  12/02/2019   COLONOSCOPY W/ POLYPECTOMY     FLEXOR DIGITORUM LONGUS TRANSFER, SPRING LIGAMENT RECONSTRUCTION, POSSIBLE TENDO ACHILLES LENGTHENING (Left   11/09/2020   KNEE ARTHROSCOPY Left 2022   Menisus tear   LEFT MEDIAL DISPLACEMENT CALCANEAL OSTEOTOMY, POSTERIOR TIBIAL TENDON DEBRIDEMENT AND TENODESIS  11/09/2020   ROBOTIC ASSISTED TOTAL HYSTERECTOMY WITH BILATERAL SALPINGO OOPHERECTOMY Bilateral 01/16/2022   Procedure: XI ROBOTIC ASSISTED TOTAL HYSTERECTOMY WITH BILATERAL SALPINGO OOPHORECTOMY;  Surgeon: Noland Fordyce, MD;  Location: [facility] ;  Service: Gynecology;  Laterality: Bilateral;  Requests 3hrs.   TONSILLECTOMY AND ADENOIDECTOMY     68 years old    OB History     Gravida  2   Para  2   Term      Preterm      AB      Living         SAB      IAB      Ectopic      Multiple      Live Births  Home Medications    Prior to Admission medications   Medication Sig Start Date End Date Taking? Authorizing Provider  amoxicillin (AMOXIL) 500 MG capsule Take 2 capsules (1,000 mg total) by mouth 3 (three) times daily for 5 days. 12/02/23 12/07/23 Yes Anesha Hackert, Ludger Nutting, NP  estradiol (ESTRACE) 0.1 MG/GM vaginal cream Place 1 Applicatorful vaginally once a week. 10/23/23  Yes [provider]  predniSONE (DELTASONE) 20 MG tablet Take 2 tablets (40 mg total) by mouth daily with breakfast for 5 days. 12/02/23 12/07/23 Yes Heavyn Yearsley, Ludger Nutting, NP  valACYclovir (VALTREX) 500 MG tablet Take 500 mg by mouth daily. 10/23/23  Yes [provider]  Biotin 5000 MCG TABS Take by mouth.    [provider]  esomeprazole (NEXIUM) 20 MG packet Take 20 mg by mouth daily before breakfast.    [provider]  fluvoxaMINE (LUVOX) 50 MG tablet Take 1 tablet (50 mg total) by mouth 2 (two) times daily. 12/12/19   Wynn Banker, MD  lamoTRIgine (LAMICTAL) 200 MG tablet Take 200 mg by mouth daily. 05/01/18   [provider]  metFORMIN (GLUCOPHAGE-XR) 500 MG 24 hr tablet Take 1 tablet (500 mg total) by mouth 2 (two) times daily. 01/01/23   Karie Georges, MD  Multiple Vitamin (MULTIVITAMIN WITH MINERALS) TABS tablet Take 1 tablet by mouth daily.    [provider]  OLANZapine (ZYPREXA) 2.5 MG tablet Take 2.5 mg by mouth at bedtime. 07/13/18   [provider]  Wheat Dextrin (BENEFIBER) CHEW Chew by mouth.    [provider]    Family History Family History  Problem Relation Age of Onset   Sleep apnea Mother    Arthritis Mother    Alzheimer's disease Mother    Brain cancer Father    Alzheimer's disease Maternal Grandmother    Colon cancer Neg Hx    Breast cancer Neg Hx    Ovarian cancer Neg Hx    Endometrial cancer Neg Hx    Pancreatic cancer Neg Hx    Prostate cancer Neg Hx     Social History Social History   Tobacco Use   Smoking status: Never   Smokeless tobacco: Never  Vaping Use   Vaping status: Never Used  Substance Use Topics   Alcohol use: Yes    Alcohol/week: 1.0 standard drink of alcohol    Types: 1 Glasses of wine per week    Comment: socially - infrequent   Drug use: Never     Allergies   Patient has no known allergies.   Review of Systems Review of Systems  Respiratory:  Positive for cough.      Physical Exam Triage Vital Signs ED Triage Vitals  Encounter Vitals Group     BP 12/02/23 1434 129/80     Systolic BP Percentile --      Diastolic BP Percentile --      Pulse Rate 12/02/23 1434 73     Resp 12/02/23 1434 16     Temp 12/02/23 1434 98.7 F (37.1 C)     Temp Source 12/02/23 1434 Oral     SpO2  12/02/23 1434 97 %     Weight 12/02/23 1427 153 lb (69.4 kg)     Height 12/02/23 1427 5\' 4"  (1.626 m)     Head Circumference --      Peak Flow --      Pain Score 12/02/23 1430 6     Pain Loc --      Pain  Education --      Exclude from [employer] Chart --    No data found.  Updated Vital Signs BP 129/80 (BP Location: Right Arm)   Pulse 73   Temp 98.7 F (37.1 C) (Oral)   Resp 16   Ht 5\' 4"  (1.626 m)   Wt 153 lb (69.4 kg)   SpO2 97%   BMI 26.26 kg/m   Visual Acuity Right Eye Distance:   Left Eye Distance:   Bilateral Distance:    Right Eye Near:   Left Eye Near:    Bilateral Near:     Physical Exam Vitals and nursing note reviewed.  Constitutional:      General: She is not in acute distress.    Appearance: She is well-developed.  HENT:     Head: Normocephalic and atraumatic.  Eyes:     Conjunctiva/sclera: Conjunctivae normal.  Cardiovascular:     Rate and Rhythm: Normal rate and regular rhythm.     Heart sounds: No murmur heard. Pulmonary:     Breath sounds: Wheezing present.  Abdominal:     Palpations: Abdomen is soft.     Tenderness: There is no abdominal tenderness.  Musculoskeletal:        General: No swelling.     Cervical back: Neck supple.  Skin:    General: Skin is warm and dry.     Capillary Refill: Capillary refill takes less than 2 seconds.  Neurological:     Mental Status: She is alert.  Psychiatric:        Mood and Affect: Mood normal.      UC Treatments / Results  Labs (all labs ordered are listed, but only abnormal results are displayed) Labs Reviewed - No data to display  EKG   Radiology DG Chest 2 View Result Date: 12/02/2023 CLINICAL DATA:  Cough EXAM: CHEST - 2 VIEW COMPARISON:  None Available. FINDINGS: The heart size and mediastinal contours are within normal limits. Both lungs are clear. The visualized skeletal structures are unremarkable. IMPRESSION: No active cardiopulmonary disease. Electronically Signed   By: Duanne Guess D.O.   On: 12/02/2023 15:21    Procedures Procedures (including critical care time)  Medications Ordered in UC Medications  albuterol (VENTOLIN HFA) 108 (90 Base) MCG/ACT inhaler 2 puff (2 puffs Inhalation Given 12/02/23 1536)  Aerochamber Plus device 1 each (1 each Other Given 12/02/23 1537)    Initial Impression / Assessment and Plan / UC Course  I have reviewed the triage vital signs and the nursing notes.  Pertinent labs & imaging results that were available during my care of the patient were reviewed by me and considered in my medical decision making (see chart for details).  Clinical Course as of 12/02/23 1851  Sun Dec 02, 2023  1518 DG Chest 2 View [DS]    Clinical Course User Index [DS] Elmer Picker, NP        High clinical suspicion for CAP to the with underlying bronchitis given length of symptoms and physical exam findings.  CXR reported as normal but suspicious for bronchial thickening Antibiotic(s), steroid burst, bronchodilator, and supportive care ordered/recommended and outlined in AVS.       Final Clinical Impressions(s) / UC Diagnoses   Final diagnoses:  Acute cough  Acute bronchitis, unspecified organism     Discharge Instructions      Your evaluation shows you have a bacterial sinus infection plus a viral infection of the upper airways of your lungs. Use the following medicines  to help with your symptoms:  - Take antibiotic sent to pharmacy as directed to treat sinus infection. - Take steroid sent to pharmacy as directed starting today. Do not take any other NSAID containing medications such as ibuprofen or naproxen/Aleve while taking prednisone. - You may use albuterol inhaler 1 to 2 puffs every 4-6 hours as needed for cough, shortness of breath, and wheezing. - Tessalon perles every 8 hours as needed for cough. - Take Promethazine DM cough medication to help with your cough at nighttime so that you are able to sleep. Do not drive,  drink alcohol, or go to work while taking this medication since it can make you sleepy. Only take this at nighttime.  - Purchase Mucinex over the counter and take this every 12 hours as needed for nasal congestion and cough.  If you develop any new or worsening symptoms or do not improve in the next 2 to 3 days, please return.  If your symptoms are severe, please go to the emergency room.  Follow-up with your primary care provider for further evaluation and management of your symptoms as well as ongoing wellness visits.  I hope you feel better!      ED Prescriptions     Medication Sig Dispense Auth. Provider   amoxicillin (AMOXIL) 500 MG capsule Take 2 capsules (1,000 mg total) by mouth 3 (three) times daily for 5 days. 30 capsule Elmer Picker, NP   predniSONE (DELTASONE) 20 MG tablet Take 2 tablets (40 mg total) by mouth daily with breakfast for 5 days. 10 tablet Elmer Picker, NP      PDMP not reviewed this encounter.   Elmer Picker, NP 12/02/23 [id number]

## 2023-12-02 NOTE — Discharge Instructions (Signed)
 Your evaluation shows you have a bacterial sinus infection plus a viral infection of the upper airways of your lungs. Use the following medicines to help with your symptoms:  - Take antibiotic sent to pharmacy as directed to treat sinus infection. - Take steroid sent to pharmacy as directed starting today. Do not take any other NSAID containing medications such as ibuprofen or naproxen/Aleve while taking prednisone. - You may use albuterol inhaler 1 to 2 puffs every 4-6 hours as needed for cough, shortness of breath, and wheezing. - Tessalon perles every 8 hours as needed for cough. - Take Promethazine DM cough medication to help with your cough at nighttime so that you are able to sleep. Do not drive, drink alcohol, or go to work while taking this medication since it can make you sleepy. Only take this at nighttime.  - Purchase Mucinex over the counter and take this every 12 hours as needed for nasal congestion and cough.  If you develop any new or worsening symptoms or do not improve in the next 2 to 3 days, please return.  If your symptoms are severe, please go to the emergency room.  Follow-up with your primary care provider for further evaluation and management of your symptoms as well as ongoing wellness visits.  I hope you feel better!

## 2023-12-02 NOTE — ED Triage Notes (Signed)
Patient here today with c/o cough, ST, headache, nasal congestion, nasal drainage, and SOB since Thursday. Patient states that she bought something OTC to test for Flu and Covid and she showed positive for Flu A. Patient had a fever earlier but she thinks that it broke. She has been taking Advil, a cough syrup, Vicks Vapor rub and Zyrtec with some relief.

## 2023-12-05 ENCOUNTER — Telehealth: Payer: Self-pay | Admitting: Family Medicine

## 2023-12-05 NOTE — Telephone Encounter (Signed)
Copied from CRM [id number]. Topic: Appointments - Transfer of Care >> Dec 05, 2023 12:52 PM Pascal Lux wrote: Pt is requesting to transfer FROM: Summer Craig Pt is requesting to transfer TO: Summer Craig Reason for requested transfer: Patient does not feel this is the right provider for her because she is not receiving the type of care she is requiring from a provider. It is the responsibility of the team the patient would like to transfer to (Dr. Betty Craig) to reach out to the patient if for any reason this transfer is not acceptable.

## 2023-12-06 DIAGNOSIS — K080 Exfoliation of teeth due to systemic causes: Secondary | ICD-10-CM | POA: Diagnosis not present

## 2023-12-06 NOTE — Telephone Encounter (Signed)
Ok with me 

## 2023-12-11 NOTE — Telephone Encounter (Signed)
Scheduled awv 01/22/24

## 2023-12-13 ENCOUNTER — Other Ambulatory Visit: Payer: Self-pay | Admitting: Dentistry

## 2023-12-13 DIAGNOSIS — M26631 Articular disc disorder of right temporomandibular joint: Secondary | ICD-10-CM

## 2023-12-18 NOTE — Telephone Encounter (Signed)
 Fine with me. BJ

## 2023-12-19 ENCOUNTER — Ambulatory Visit
Admission: RE | Admit: 2023-12-19 | Discharge: 2023-12-19 | Disposition: A | Discharge status: Home / Self Care | Payer: Medicare Other | Source: Ambulatory Visit | Attending: Dentistry | Admitting: Dentistry

## 2023-12-19 DIAGNOSIS — M2669 Other specified disorders of temporomandibular joint: Secondary | ICD-10-CM | POA: Diagnosis not present

## 2023-12-19 DIAGNOSIS — M26631 Articular disc disorder of right temporomandibular joint: Secondary | ICD-10-CM

## 2023-12-19 NOTE — Telephone Encounter (Signed)
 Lmom for pt to sch TOC with dr [place]

## 2023-12-25 ENCOUNTER — Encounter: Payer: Self-pay | Admitting: Obstetrics

## 2023-12-25 ENCOUNTER — Ambulatory Visit: Payer: Medicare Other | Admitting: Obstetrics

## 2023-12-25 ENCOUNTER — Other Ambulatory Visit (HOSPITAL_COMMUNITY)
Admission: RE | Admit: 2023-12-25 | Discharge: 2023-12-25 | Disposition: A | Discharge status: Home / Self Care | Payer: Medicare Other | Source: Ambulatory Visit | Attending: Obstetrics | Admitting: Obstetrics

## 2023-12-25 VITALS — BP 103/70 | HR 71 | Ht 64.37 in | Wt 154.8 lb

## 2023-12-25 DIAGNOSIS — R8290 Unspecified abnormal findings in urine: Secondary | ICD-10-CM | POA: Insufficient documentation

## 2023-12-25 DIAGNOSIS — R159 Full incontinence of feces: Secondary | ICD-10-CM | POA: Insufficient documentation

## 2023-12-25 DIAGNOSIS — R319 Hematuria, unspecified: Secondary | ICD-10-CM | POA: Insufficient documentation

## 2023-12-25 DIAGNOSIS — N3946 Mixed incontinence: Secondary | ICD-10-CM | POA: Diagnosis not present

## 2023-12-25 DIAGNOSIS — N952 Postmenopausal atrophic vaginitis: Secondary | ICD-10-CM

## 2023-12-25 LAB — POCT URINALYSIS DIPSTICK
Bilirubin, UA: NEGATIVE
Glucose, UA: NEGATIVE
Ketones, UA: NEGATIVE
Leukocytes, UA: NEGATIVE
Nitrite, UA: NEGATIVE
Protein, UA: NEGATIVE
Spec Grav, UA: 1.02 (ref 1.010–1.025)
Urobilinogen, UA: 0.2 U/dL
pH, UA: 5.5 (ref 5.0–8.0)

## 2023-12-25 LAB — URINALYSIS, ROUTINE W REFLEX MICROSCOPIC
Bilirubin Urine: NEGATIVE
Glucose, UA: NEGATIVE mg/dL
Hgb urine dipstick: NEGATIVE
Ketones, ur: NEGATIVE mg/dL
Leukocytes,Ua: NEGATIVE
Nitrite: NEGATIVE
Protein, ur: NEGATIVE mg/dL
Specific Gravity, Urine: 1.016 (ref 1.005–1.030)
pH: 5 (ref 5.0–8.0)

## 2023-12-25 MED ORDER — GEMTESA 75 MG PO TABS
75.0000 mg | ORAL_TABLET | Freq: Every day | ORAL | Status: DC
Start: 2023-12-25 — End: 2024-01-11

## 2023-12-25 MED ORDER — GEMTESA 75 MG PO TABS: 75.0000 mg | ORAL_TABLET | Freq: Every day | ORAL | 2 refills | Status: DC

## 2023-12-25 MED ORDER — ESTRADIOL 0.1 MG/GM VA CREA: 0.5000 g | TOPICAL_CREAM | VAGINAL | 3 refills | Status: DC

## 2023-12-25 NOTE — Assessment & Plan Note (Addendum)
-   POCT + heme, pending UA microscopy and culture - urgency > stress, onset after left ankle injury and surgery - failed pelvic floor PT and mirabegron - We discussed the symptoms of overactive bladder (OAB), which include urinary urgency, urinary frequency, nocturia, with or without urge incontinence.  While we do not know the exact etiology of OAB, several treatment options exist. We discussed management including behavioral therapy (decreasing bladder irritants, urge suppression strategies, timed voids, bladder retraining), physical therapy, medication; for refractory cases posterior tibial nerve stimulation, sacral neuromodulation, and intravesical botulinum toxin injection.  For anticholinergic medications, we discussed the potential side effects of anticholinergics including dry eyes, dry mouth, constipation, cognitive impairment and urinary retention. For Beta-3 agonist medication, we discussed the potential side effect of elevated blood pressure which is more likely to occur in individuals with uncontrolled hypertension. - trial of gemtesa - behavioral modification with fluid management - For treatment of stress urinary incontinence,  non-surgical options include expectant management, weight loss, physical therapy, as well as a pessary.  Surgical options include a midurethral sling, Burch urethropexy, and transurethral injection of a bulking agent. - resume vaginal estrogen

## 2023-12-25 NOTE — Progress Notes (Signed)
New Patient Evaluation and Consultation  Referring Provider: Carver Fila, MD PCP: Karie Georges, MD Date of Service: 12/25/2023  SUBJECTIVE Chief Complaint: New Patient (Initial Visit) (Carlissa D Coachman is a 68 y.o. female here today for urinary incontinence with some fecal incontinence. )  History of Present Illness: Rissie D Kropp is a 68 y.o. White or Caucasian female seen in consultation at the request of Dr Pricilla Holm for evaluation of stress urinary incontinence.    Urinary urgency without leakage started 6 years ago when she moved to [place] and did not have a bathroom during renovations. Reports urinary leakage with coughing, sneezing, laughing started 4 years ago, minimal bother due to minimal leakage managed with 1 panty liner/day. Denies changes in medication, surgery, trauma, weight changes.  Larey Seat off a horse and broke her ankle in 2021, underwent left foot surgery. Increased urinary leakage with activity (postural changes) and urgency (turn on the water) around 1.5-2 years ago with increased size of pad prior to hysterectomy.  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 Ernestina Penna 01/16/22. 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 Susa Simmonds after ankle injury when she fell off a horse  Urinary Symptoms: Leaks urine with cough/ sneeze, laughing, exercise, lifting, going from sitting to standing, during sex,  with a full bladder, with urgency, and without sensation Leaks 6-10 time(s) per week with urgency due to decreased sensation with larger volume leakage Leaks 5x/week with coughing/sneezing when her bladder is full Pad use: 1 liners/ mini-pads per day.   Patient is bothered by UI symptoms.  Day time voids 4-6.  Nocturia: 0-1 times per night to void. Voiding dysfunction:  empties bladder well.  Patient does not use a catheter to empty bladder.  When urinating, patient feels dribbling after finishing Drinks: 160oz water per day, 1 cup of coffee 10x/month   UTIs:  0  UTI's in the last year.   Denies history of blood in urine, kidney or bladder stones, pyelonephritis, bladder cancer, and kidney cancer No results found for the last 90 days.   Pelvic Organ Prolapse Symptoms:                  Patient Denies a feeling of a bulge the vaginal area.   Bowel Symptom: Bowel movements: 1-2 time(s) per day with diverticulosis and history of diverticulitis Stool consistency: soft  or loose Straining: no.  Splinting: no.  Incomplete evacuation: no.  Patient Admits to accidental bowel leakage / fecal incontinence  Occurs: 5-6 time(s) per year, cannot identify triggers, occurs at night when she wakes up with loose stool in her underwear  Consistency with leakage: liquid, spinach causes loose stool Bowel regimen: fiber Last colonoscopy: repeat in 10 years. History of EGD for esophageal dysphagia  [facility] Colonoscopy          Upcoming     Colonoscopy (Every 10 Years) Next due on 12/01/2029    12/02/2019  Done - [facility]-see report   Only  the first 1 history entries have been loaded, but more history exists.                Sexual Function Sexually active: no.  Sexual orientation: Straight Pain with sex: at the vaginal opening, has discomfort due to dryness  Pelvic Pain Denies pelvic pain  Past Medical History:  Past Medical History:  Diagnosis Date   Arthritis    both knees    Bipolar disorder (HCC)    Colon polyp    Depression    Diverticulitis 2021   DVT (deep venous thrombosis) (HCC)    bunion surgery- post surgery- per patient. Was on blood thinners for short period. More than 10 years ago per pt on 01/10/22.   Dysplasia of cervix, low grade (CIN 1) 12/14/2011   LEEP procedures x 2   Genital warts    GERD (gastroesophageal reflux disease)    nexium as needed   Herpes genitalia    History of hiatal hernia    Pre-diabetes    Patient takes Metformin and Ozempic to help with blood sugar and weight loss.   Urine incontinence    Uterine cancer (HCC)    found in cell walls at hysterectomy     Past Surgical History:   Past Surgical History:  Procedure Laterality Date   ARTHRODESIS FOOT WITH WEIL OSTEOTOMY Left 11/09/2020   Procedure: LEFT MEDIAL DISPLACEMENT CALCANEAL OSTEOTOMY, POSTERIOR TIBIAL TENDON DEBRIDEMENT AND TENODESIS, FLEXOR DIGITORUM LONGUS TRANSFER, SPRING LIGAMENT RECONSTRUCTION, POSSIBLE TENDO ACHILLES LENGTHENING;  Surgeon: Terance Hart, MD;  Location: [facility] OR;  Service: Orthopedics;  Laterality: Left;  LENGTH OF SURGERY: 2.5 HOURS   bunion removal Left    BUNIONECTOMY Right    COLONOSCOPY  12/02/2019   COLONOSCOPY W/ POLYPECTOMY     FLEXOR DIGITORUM LONGUS TRANSFER, SPRING LIGAMENT RECONSTRUCTION, POSSIBLE TENDO ACHILLES LENGTHENING (Left   11/09/2020   KNEE ARTHROSCOPY Left 2022   Menisus tear   LEFT MEDIAL DISPLACEMENT CALCANEAL OSTEOTOMY, POSTERIOR TIBIAL TENDON DEBRIDEMENT AND TENODESIS  11/09/2020   ROBOTIC ASSISTED TOTAL HYSTERECTOMY WITH BILATERAL SALPINGO OOPHERECTOMY Bilateral 01/16/2022   Procedure: XI ROBOTIC ASSISTED TOTAL HYSTERECTOMY WITH BILATERAL SALPINGO OOPHORECTOMY;  Surgeon: Noland Fordyce, MD;  Location: [facility];  Service: Gynecology;  Laterality: Bilateral;  Requests 3hrs.   TONSILLECTOMY AND ADENOIDECTOMY     68 years old     Past OB/GYN History: OB History  Gravida Para Term Preterm AB  Living  2 2 2   2   SAB IAB Ectopic Multiple Live Births      2    # Outcome Date GA Lbr Len/2nd Weight Sex Type Anes PTL Lv  2 Term     F Vag-Spont   LIV  1 Term     F Vag-Spont   LIV    Vaginal deliveries: 2, largest infant 8lbs Forceps/ Vacuum deliveries: 0, Cesarean section: 0 Menopausal: Yes, at age 50, Denies vaginal bleeding since menopause Contraception: s/p hysterectomy. Last pap smear was 03/20/2023 by Dr. Ernestina Penna.  Any history of abnormal pap smears: yes s/p LEEP x 2 No results found for: "DIAGPAP", "HPVHIGH", "ADEQPAP"  Medications: Patient has a current medication list which includes the following prescription(s): biotin, [START ON 12/27/2023] estradiol, fluvoxamine, lamotrigine, metformin, multivitamin with minerals, olanzapine, gemtesa, gemtesa, and benefiber.   Allergies: Patient has no known allergies.   Social History:  Social History   Tobacco Use   Smoking status: Never   Smokeless tobacco: Never  Vaping Use   Vaping status: Never Used  Substance Use Topics   Alcohol use: Yes    Alcohol/week: 1.0 standard drink of alcohol    Types: 1 Glasses of wine per week    Comment: socially - infrequent   Drug use: Never    Relationship status: divorced Patient lives by herself.   Patient is employed as a Engineer, civil (consulting). Regular exercise: Yes: walks 3-4 miles History of abuse: No  Family History:   Family History  Problem Relation Age of Onset   Sleep apnea Mother    Arthritis Mother    Alzheimer's disease Mother    Brain cancer Father    Alzheimer's disease Maternal Grandmother    Colon cancer Neg Hx    Breast cancer Neg Hx    Ovarian cancer Neg Hx    Endometrial cancer Neg Hx    Pancreatic cancer Neg Hx    Prostate cancer Neg Hx    Bladder Cancer Neg Hx    Uterine cancer Neg Hx      Review of Systems: Review of Systems  Constitutional:  Negative for fever, malaise/fatigue and weight loss.  Respiratory:  Negative for cough, shortness of breath  and wheezing.   Cardiovascular:  Negative for chest pain, palpitations and leg swelling.  Gastrointestinal:  Negative for abdominal pain, blood in stool and constipation.  Genitourinary:  Positive for urgency. Negative for dysuria, frequency and hematuria.       Leakage  Skin:  Negative for rash.  Neurological:  Negative for dizziness, weakness and headaches.  Endo/Heme/Allergies:  Bruises/bleeds easily.  Psychiatric/Behavioral:  Positive for depression. The patient is not nervous/anxious.      OBJECTIVE Physical Exam: Vitals:   12/25/23 0949  BP: 103/70  Pulse: 71  Weight: 154 lb 12.8 oz (70.2 kg)  Height: 5' 4.37" (1.635 m)    Physical Exam Constitutional:      General: She is not in acute distress.    Appearance: Normal appearance.  Genitourinary:     Bladder and urethral meatus normal.     No lesions in the vagina.     Right Labia: No rash, tenderness, lesions, skin changes or Bartholin's cyst.    Left Labia: No tenderness, lesions, skin changes, Bartholin's cyst or rash.    No vaginal discharge, erythema, tenderness, bleeding, ulceration or granulation tissue.     Moderate vaginal atrophy present.     Right Adnexa: not tender, not full and no mass present.    Left Adnexa: not tender, not full and no mass present.    Cervix is absent.     Uterus is absent.     Urethral meatus caruncle not present.    No urethral prolapse, tenderness, mass, hypermobility, discharge or stress urinary incontinence with cough stress test present.     Bladder is not tender, urgency on palpation not present and masses not present.      Pelvic Floor: Levator muscle strength is 3/5.    Levator ani not tender, obturator internus not tender, no asymmetrical contractions present and no pelvic spasms present.    Symmetrical pelvic sensation, anal wink present and BC reflex present. Cardiovascular:     Rate and Rhythm: Normal rate.  Pulmonary:     Effort: Pulmonary effort is normal. No  respiratory distress.  Abdominal:     General: There is no distension.     Palpations: Abdomen is soft. There is no mass.     Tenderness: There is no abdominal tenderness.     Hernia: No hernia is present.  Neurological:     Mental Status: She is alert.  Vitals reviewed. Exam conducted with a chaperone present.     POP-Q:   POP-Q  -2                                            Aa   -2                                           Ba  -4                                              C   2                                            Gh  3                                            Pb  5                                            tvl   -2                                            Ap  -2                                            Bp                                                 D    Post-Void Residual (PVR) by Bladder Scan: In order to evaluate bladder emptying, we discussed obtaining a postvoid residual and patient agreed to this procedure.  Procedure: The ultrasound unit was placed on the patient's abdomen in the suprapubic region after the patient had voided.    Post Void Residual - 12/25/23 0951       Post Void Residual   Post Void Residual 8 mL              Laboratory Results: Lab Results  Component Value Date   COLORU Yellow 12/25/2023   CLARITYU Clear 12/25/2023   GLUCOSEUR Negative 12/25/2023   BILIRUBINUR Negative 12/25/2023   KETONESU Negative 12/25/2023   SPECGRAV 1.020 12/25/2023   RBCUR small 12/25/2023   PHUR 5.5 12/25/2023   PROTEINUR Negative 12/25/2023   UROBILINOGEN 0.2 12/25/2023   LEUKOCYTESUR Negative 12/25/2023    Lab  Results  Component Value Date   CREATININE 0.71 12/12/2022   CREATININE 0.69 01/12/2022   CREATININE 0.82 12/09/2021    Lab Results  Component Value Date   HGBA1C 5.7 12/12/2022    Lab Results  Component Value Date   HGB 10.7 (L) 01/17/2022     ASSESSMENT AND PLAN Ms. Madrid is a 68 y.o. with:   1. Urinary incontinence, mixed   2. Incontinence of feces, unspecified fecal incontinence type   3. Vaginal atrophy   4. Abnormal urinalysis     Urinary incontinence, mixed Assessment & Plan: - POCT + heme, pending UA microscopy and culture - urgency > stress - failed pelvic floor PT and mirabegron - We discussed the symptoms of overactive bladder (OAB), which include urinary urgency, urinary frequency, nocturia, with or without urge incontinence.  While we do not know the exact etiology of OAB, several treatment options exist. We discussed management including behavioral therapy (decreasing bladder irritants, urge suppression strategies, timed voids, bladder retraining), physical therapy, medication; for refractory cases posterior tibial nerve stimulation, sacral neuromodulation, and intravesical botulinum toxin injection.  For anticholinergic medications, we discussed the potential side effects of anticholinergics including dry eyes, dry mouth, constipation, cognitive impairment and urinary retention. For Beta-3 agonist medication, we discussed the potential side effect of elevated blood pressure which is more likely to occur in individuals with uncontrolled hypertension. - trial of gemtesa - behavioral modification with fluid management - For treatment of stress urinary incontinence,  non-surgical options include expectant management, weight loss, physical therapy, as well as a pessary.  Surgical options include a midurethral sling, Burch urethropexy, and transurethral injection of a bulking agent. - resume vaginal estrogen  Orders: -     POCT urinalysis dipstick -     Estradiol; Place 0.5 g vaginally 2 (two) times a week. Place 0.5g nightly for two weeks then twice a week after  Dispense: 30 g; Refill: 3 -     Gemtesa; Take 1 tablet (75 mg total) by mouth daily for 28 days. Leslye Peer; Take 1 tablet (75 mg total) by mouth daily.  Dispense: 30 tablet; Refill: 2  Incontinence of  feces, unspecified fecal incontinence type Assessment & Plan: - 5 episodes/year with liquid stool - denies IBS, reports spinach as dietary trigger - Treatment options include anti-diarrhea medication (loperamide/ Imodium OTC or prescription lomotil), fiber supplements, physical therapy, and possible sacral neuromodulation or surgery.   - encouraged Kegel exercises and diary to record episodes - encouraged trial of IBGuard with samples provided   Vaginal atrophy Assessment & Plan: - For symptomatic vaginal atrophy options include lubrication with a water-based lubricant, personal hygiene measures and barrier protection against wetness, and estrogen replacement in the form of vaginal cream, vaginal tablets, or a time-released vaginal ring.   - Rx to resume vaginal estrogen   Orders: -     Estradiol; Place 0.5 g vaginally 2 (two) times a week. Place 0.5g nightly for two weeks then twice a week after  Dispense: 30 g; Refill: 3  Abnormal urinalysis Assessment & Plan: - POCT UA + heme, pending clean catch UA micro and culture - For management of microscopic hematuria, we discussed the importance of work-up including assessing the upper and lower GU tract with CT urogram and cystoscopy. She will pursue this work-up and follow-up afterward to discuss the results and decide on a treatment plan based on the findings.  - will repeat UA with catheterized sample if positive - denies tobacco  use or pelvic radiation  Orders: -     Urine Microscopic; Future  Time spent: I spent 69 minutes dedicated to the care of this patient on the date of this encounter to include pre-visit review of records, face-to-face time with the patient discussing mixed urinary incontinence, fecal incontinence, vaginal atrophy, abnormal UA, and post visit documentation and ordering medication/ testing.   Loleta Chance, MD

## 2023-12-25 NOTE — Assessment & Plan Note (Signed)
-   For symptomatic vaginal atrophy options include lubrication with a water-based lubricant, personal hygiene measures and barrier protection against wetness, and estrogen replacement in the form of vaginal cream, vaginal tablets, or a time-released vaginal ring.   - Rx to resume vaginal estrogen

## 2023-12-25 NOTE — Patient Instructions (Addendum)
We discussed the symptoms of overactive bladder (OAB), which include urinary urgency, urinary frequency, night-time urination, with or without urge incontinence.  We discussed management including behavioral therapy (decreasing bladder irritants by following a bladder diet, urge suppression strategies, timed voids, bladder retraining), physical therapy, medication; and for refractory cases posterior tibial nerve stimulation, sacral neuromodulation, and intravesical botulinum toxin injection.   For Beta-3 agonist medication, we discussed the potential side effect of elevated blood pressure which is more likely to occur in individuals with uncontrolled hypertension. You were given samples for Gemtesa 75 mg.  It can take a month to start working so give it time, but if you have bothersome side effects call sooner and we can try a different medication.  Call us if you have trouble filling the prescription or if it's not covered by your insurance.  Reduce your fluid intake.  For treatment of stress urinary incontinence, which is leakage with physical activity/movement/strainging/coughing, we discussed expectant management versus nonsurgical options versus surgery. Nonsurgical options include weight loss, physical therapy, as well as a pessary.  Surgical options include a midurethral sling, which is a synthetic mesh sling that acts like a hammock under the urethra to prevent leakage of urine, a Burch urethropexy, and transurethral injection of a bulking agent.   Accidental Bowel Leakage:  - Treatment options include anti-diarrhea medication (loperamide/ Imodium OTC or prescription lomotil), fiber supplements, physical therapy, and possible sacral neuromodulation or surgery.     Please record your leakage episodes to identify any triggers.  Consider trial of IBGuard.  For symptomatic vaginal atrophy options include lubrication with a water-based lubricant, personal hygiene measures and barrier protection  against wetness, and estrogen replacement in the form of vaginal cream, vaginal tablets, or a time-released vaginal ring.     For vaginal atrophy (thinning of the vaginal tissue that can cause dryness and burning) and UTI prevention we discussed estrogen replacement in the form of vaginal cream.   Start vaginal estrogen therapy nightly for two weeks then 2 times weekly at night. This can be placed with your finger or an applicator inside the vagina and around the urethra.  Please let us know if the prescription is too expensive and we can look for alternative options.   Is vaginal estrogen therapy safe for me? Vaginal estrogen preparations act on the vaginal skin, and only a very tiny amount is absorbed into the bloodstream (0.01%).  They work in a similar way to hand or face cream.  There is minimal absorption and they are therefore perfectly safe. If you have had breast cancer and have persistent troublesome symptoms which aren't settling with vaginal moisturisers and lubricants, local estrogen treatment may be a possibility, but consultation with your oncologist should take place first.

## 2023-12-25 NOTE — Assessment & Plan Note (Signed)
-   5 episodes/year with liquid stool - denies IBS, reports spinach as dietary trigger - Treatment options include anti-diarrhea medication (loperamide/ Imodium OTC or prescription lomotil), fiber supplements, physical therapy, and possible sacral neuromodulation or surgery.   - encouraged Kegel exercises and diary to record episodes - encouraged trial of IBGuard with samples provided

## 2023-12-25 NOTE — Assessment & Plan Note (Signed)
-   POCT UA + heme, pending clean catch UA micro and culture - For management of microscopic hematuria, we discussed the importance of work-up including assessing the upper and lower GU tract with CT urogram and cystoscopy. She will pursue this work-up and follow-up afterward to discuss the results and decide on a treatment plan based on the findings.  - will repeat UA with catheterized sample if positive - denies tobacco use or pelvic radiation

## 2023-12-26 ENCOUNTER — Encounter: Payer: Self-pay | Admitting: Obstetrics

## 2023-12-31 NOTE — Telephone Encounter (Signed)
Pt has been sch with dr Sw[place]ilandjordan

## 2024-01-11 ENCOUNTER — Encounter: Payer: Self-pay | Admitting: Family Medicine

## 2024-01-11 ENCOUNTER — Ambulatory Visit (INDEPENDENT_AMBULATORY_CARE_PROVIDER_SITE_OTHER): Payer: Medicare Other | Admitting: Family Medicine

## 2024-01-11 VITALS — BP 104/80 | HR 66 | Temp 97.8°F | Ht 64.0 in | Wt 153.7 lb

## 2024-01-11 DIAGNOSIS — Z0000 Encounter for general adult medical examination without abnormal findings: Secondary | ICD-10-CM

## 2024-01-11 DIAGNOSIS — Z13220 Encounter for screening for lipoid disorders: Secondary | ICD-10-CM | POA: Diagnosis not present

## 2024-01-11 DIAGNOSIS — R7301 Impaired fasting glucose: Secondary | ICD-10-CM | POA: Diagnosis not present

## 2024-01-11 LAB — POCT GLYCOSYLATED HEMOGLOBIN (HGB A1C): Hemoglobin A1C: 5.6 % (ref 4.0–5.6)

## 2024-01-11 MED ORDER — METFORMIN HCL ER 500 MG PO TB24: 500.0000 mg | ORAL_TABLET | Freq: Two times a day (BID) | ORAL | 2 refills | Status: AC

## 2024-01-11 NOTE — Progress Notes (Addendum)
 Subjective:   Summer Craig is a 68 y.o. female who presents for an Initial Medicare Annual Wellness Visit.  Visit Complete: In person  Patient Medicare AWV questionnaire was completed by the patient on 01/11/2024; I have confirmed that all information answered by patient is correct and no changes since this date.     Objective:    Today's Vitals   01/11/24 1501  BP: 104/80  Pulse: 66  Temp: 97.8 F (36.6 C)  TempSrc: Oral  SpO2: 99%  Weight: 153 lb 11.2 oz (69.7 kg)  Height: 5\' 4"  (1.626 m)   Body mass index is 26.38 kg/m.     01/11/2024    3:00 PM 01/27/2022    3:43 PM 01/16/2022   11:45 AM 11/11/2020    4:18 PM 11/09/2020    4:07 PM 11/02/2020    2:30 PM 08/11/2019    7:09 PM  Advanced Directives  Does Patient Have a Medical Advance Directive? No Yes No Yes Yes Yes Yes  Type of Theme park manager;Living will Healthcare Power of [place];Living will Living will;Healthcare Power of [employer] Power of [place];Living will  Does patient want to make changes to medical advance directive?  No - Patient declined   No - Patient declined    Copy of Healthcare Power of Attorney in Chart?    No - copy requested No - copy requested No - copy requested   Would patient like information on creating a medical advance directive? Yes (MAU/Ambulatory/Procedural Areas - Information given)  No - Patient declined        Current Medications (verified) Outpatient Encounter Medications as of 01/11/2024  Medication Sig   Biotin 5000 MCG TABS Take by mouth.   estradiol (ESTRACE) 0.1 MG/GM vaginal cream Place 0.5 g vaginally 2 (two) times a week. Place 0.5g nightly for two weeks then twice a week after   fluvoxaMINE (LUVOX) 50 MG tablet Take 1 tablet (50 mg total) by mouth 2 (two) times daily.   lamoTRIgine (LAMICTAL) 200 MG tablet Take 200 mg by mouth daily.   Multiple Vitamin (MULTIVITAMIN WITH MINERALS) TABS tablet Take 1 tablet by mouth daily.    OLANZapine (ZYPREXA) 2.5 MG tablet Take 2.5 mg by mouth at bedtime.   Wheat Dextrin (BENEFIBER) CHEW Chew by mouth.   [DISCONTINUED] metFORMIN (GLUCOPHAGE-XR) 500 MG 24 hr tablet Take 1 tablet (500 mg total) by mouth 2 (two) times daily.   [DISCONTINUED] Vibegron (GEMTESA) 75 MG TABS Take 1 tablet (75 mg total) by mouth daily for 28 days.   metFORMIN (GLUCOPHAGE-XR) 500 MG 24 hr tablet Take 1 tablet (500 mg total) by mouth 2 (two) times daily.   [DISCONTINUED] Vibegron (GEMTESA) 75 MG TABS Take 1 tablet (75 mg total) by mouth daily.   No facility-administered encounter medications on file as of 01/11/2024.    Allergies (verified) Patient has no known allergies.   History: Past Medical History:  Diagnosis Date   Arthritis    both knees   Bipolar disorder (HCC)    Colon polyp    Depression    Diverticulitis 2021   DVT (deep venous thrombosis) (HCC)    bunion surgery- post surgery- per patient. Was on blood thinners for short period. More than 10 years ago per pt on 01/10/22.   Dysplasia of cervix, low grade (CIN 1) 12/14/2011   LEEP procedures x 2   Genital warts    GERD (gastroesophageal reflux disease)    nexium as needed  Herpes genitalia    History of hiatal hernia    Pre-diabetes    Patient takes Metformin and Ozempic to help with blood sugar and weight loss.   Urine incontinence    Uterine cancer (HCC)    found in cell walls at hysterectomy   Past Surgical History:  Procedure Laterality Date   ARTHRODESIS FOOT WITH WEIL OSTEOTOMY Left 11/09/2020   Procedure: LEFT MEDIAL DISPLACEMENT CALCANEAL OSTEOTOMY, POSTERIOR TIBIAL TENDON DEBRIDEMENT AND TENODESIS, FLEXOR DIGITORUM LONGUS TRANSFER, SPRING LIGAMENT RECONSTRUCTION, POSSIBLE TENDO ACHILLES LENGTHENING;  Surgeon: Terance Hart, MD;  Location: [facility] OR;  Service: Orthopedics;  Laterality: Left;  LENGTH OF SURGERY: 2.5 HOURS   bunion removal Left    BUNIONECTOMY Right    COLONOSCOPY  12/02/2019   COLONOSCOPY W/  POLYPECTOMY     FLEXOR DIGITORUM LONGUS TRANSFER, SPRING LIGAMENT RECONSTRUCTION, POSSIBLE TENDO ACHILLES LENGTHENING (Left   11/09/2020   KNEE ARTHROSCOPY Left 2022   Menisus tear   LEFT MEDIAL DISPLACEMENT CALCANEAL OSTEOTOMY, POSTERIOR TIBIAL TENDON DEBRIDEMENT AND TENODESIS  11/09/2020   ROBOTIC ASSISTED TOTAL HYSTERECTOMY WITH BILATERAL SALPINGO OOPHERECTOMY Bilateral 01/16/2022   Procedure: XI ROBOTIC ASSISTED TOTAL HYSTERECTOMY WITH BILATERAL SALPINGO OOPHORECTOMY;  Surgeon: Noland Fordyce, MD;  Location: [facility];  Service: Gynecology;  Laterality: Bilateral;  Requests 3hrs.   TONSILLECTOMY AND ADENOIDECTOMY     67 years old   Family History  Problem Relation Age of Onset   Sleep apnea Mother    Arthritis Mother    Alzheimer's disease Mother    Brain cancer Father    Alzheimer's disease Maternal Grandmother    Colon cancer Neg Hx    Breast cancer Neg Hx    Ovarian cancer Neg Hx    Endometrial cancer Neg Hx    Pancreatic cancer Neg Hx    Prostate cancer Neg Hx    Bladder Cancer Neg Hx    Uterine cancer Neg Hx    Social History   Socioeconomic History   Marital status: Divorced    Spouse name: Not on file   Number of children: Not on file   Years of education: Not on file   Highest education level: Bachelor's degree (e.g., BA, AB, BS)  Occupational History   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)  Tobacco Use   Smoking status: Never   Smokeless tobacco: Never  Vaping Use   Vaping status: Never Used  Substance and Sexual Activity   Alcohol use: Yes    Alcohol/week: 1.0 standard drink of alcohol    Types: 1 Glasses of wine per week    Comment: socially - infrequent   Drug use: Never   Sexual activity: Not Currently    Partners: Male    Birth control/protection: Post-menopausal  Other Topics Concern   Not on file  Social History Narrative   Not on file   Social Drivers of Health   Financial Resource Strain: Low Risk  (01/07/2024)    Overall Financial Resource Strain (CARDIA)    Difficulty of Paying Living Expenses: Not hard at all  Food Insecurity: No Food Insecurity (01/07/2024)   Hunger Vital Sign    Worried About Running Out of Food in the Last Year: Never true    Ran Out of Food in the Last Year: Never true  Transportation Needs: No Transportation Needs (01/07/2024)   PRAPARE - Administrator, Civil Service (Medical): No    Lack of Transportation (Non-Medical): No  Physical Activity: Sufficiently Active (01/07/2024)  Exercise Vital Sign    Days of Exercise per Week: 7 days    Minutes of Exercise per Session: 30 min  Stress: No Stress Concern Present (01/07/2024)   [employer] of Occupational Health - Occupational Stress Questionnaire    Feeling of Stress : Only a little  Social Connections: Moderately Integrated (01/07/2024)   Social Connection and Isolation Panel [NHANES]    Frequency of Communication with Friends and Family: More than three times a week    Frequency of Social Gatherings with Friends and Family: Twice a week    Attends Religious Services: More than 4 times per year    Active Member of [employer] or Organizations: Yes    Attends Banker Meetings: More than 4 times per year    Marital Status: Divorced    Tobacco Counseling Counseling given: Not Answered   Activities of Daily Living    01/07/2024    9:20 PM  In your present state of health, do you have any difficulty performing the following activities:  Hearing? 0  Vision? 0  Difficulty concentrating or making decisions? 0  Walking or climbing stairs? 0  Dressing or bathing? 0  Doing errands, shopping? 0  Preparing Food and eating ? N  Using the Toilet? N  In the past six months, have you accidently leaked urine? Y  Do you have problems with loss of bowel control? Y  Managing your Medications? N  Managing your Finances? N  Housekeeping or managing your Housekeeping? N    Patient Care Team: Karie Georges, MD as PCP - General (Family Medicine) Noland Fordyce, MD as Consulting Physician (Obstetrics and Gynecology)  Indicate any recent Medical Services you may have received from other than [facility] providers in the past year (date may be approximate). Physical Exam Vitals reviewed.  Constitutional:      Appearance: Normal appearance. She is normal weight.  HENT:     Right Ear: Tympanic membrane and ear canal normal.     Left Ear: Tympanic membrane and ear canal normal.     Mouth/Throat:     Mouth: Mucous membranes are moist.     Pharynx: No posterior oropharyngeal erythema.  Eyes:     Conjunctiva/sclera: Conjunctivae normal.  Neck:     Thyroid: No thyromegaly.  Cardiovascular:     Rate and Rhythm: Normal rate and regular rhythm.     Pulses: Normal pulses.     Heart sounds: No murmur heard. Pulmonary:     Effort: Pulmonary effort is normal.     Breath sounds: Normal breath sounds. No wheezing.  Abdominal:     General: Bowel sounds are normal.  Musculoskeletal:     Right lower leg: No edema.     Left lower leg: No edema.  Neurological:     Mental Status: She is alert and oriented to person, place, and time. Mental status is at baseline.  Psychiatric:        Mood and Affect: Mood normal.        Behavior: Behavior normal.        Thought Content: Thought content normal.        Assessment:   This is a routine wellness examination for Summer Craig.  Hearing/Vision screen No results found.   Goals Addressed   None   Depression Screen    01/11/2024   3:01 PM 12/12/2022   10:54 AM 08/08/2022    4:08 PM 12/12/2019    2:50 PM 07/25/2018    9:24 AM  PHQ 2/9 Scores  PHQ - 2 Score 0 0 0 0 0  PHQ- 9 Score 0 0 0      Fall Risk    01/11/2024    3:00 PM 01/07/2024    9:20 PM 07/25/2018    9:24 AM  Fall Risk   Falls in the past year? 1 0 No  Number falls in past yr: 0 0   Injury with Fall? 0 0   Risk for fall due to : Impaired balance/gait    Follow up Falls evaluation  completed      MEDICARE RISK AT HOME: Medicare Risk at Home If so, are there any without handrails?: (Patient-Rptd) No Home free of loose throw rugs in walkways, pet beds, electrical cords, etc?: (Patient-Rptd) Yes Adequate lighting in your home to reduce risk of falls?: (Patient-Rptd) Yes Life alert?: (Patient-Rptd) No Use of a cane, walker or w/c?: (Patient-Rptd) No Grab bars in the bathroom?: (Patient-Rptd) No Shower chair or bench in shower?: (Patient-Rptd) Yes Elevated toilet seat or a handicapped toilet?: (Patient-Rptd) Yes  TIMED UP AND GO:  Was the test performed? Yes  Length of time to ambulate 10 feet: 6 sec Gait steady and fast without use of assistive device    Cognitive Function:        01/11/2024    3:16 PM  6CIT Screen  What Year? 0 points  What month? 0 points  What time? 0 points  Count back from 20 0 points  Months in reverse 0 points  Repeat phrase 0 points  Total Score 0 points    Immunizations Immunization History  Administered Date(s) Administered   Fluad Quad(high Dose 65+) 12/12/2022   Influenza-Unspecified 10/13/2021   Moderna Sars-Covid-2 Vaccination 10/19/2020   PFIZER(Purple Top)SARS-COV-2 Vaccination 02/12/2020, 03/08/2020   PNEUMOCOCCAL CONJUGATE-20 12/12/2022   Pneumococcal Conjugate-13 10/06/2013   Tdap 09/02/2012, 01/10/2019   Zoster Recombinant(Shingrix) 12/12/2019, 03/12/2020    TDAP status: Up to date  Flu Vaccine status: Declined, Education has been provided regarding the importance of this vaccine but patient still declined. Advised may receive this vaccine at local pharmacy or Health Dept. Aware to provide a copy of the vaccination record if obtained from local pharmacy or Health Dept. Verbalized acceptance and understanding.  Pneumococcal vaccine status: Up to date  Covid-19 vaccine status: Completed vaccines  Qualifies for Shingles Vaccine? Yes   Zostavax completed No   Shingrix Completed?: Yes  Screening  Tests Health Maintenance  Topic Date Due   INFLUENZA VACCINE  06/14/2023   COVID-19 Vaccine (4 - 2024-25 season) 07/15/2023   MAMMOGRAM  02/01/2024   Medicare Annual Wellness (AWV)  01/10/2025   DTaP/Tdap/Td (3 - Td or Tdap) 01/10/2029   Colonoscopy  12/01/2029   Pneumonia Vaccine 75+ Years old  Completed   DEXA SCAN  Completed   Hepatitis C Screening  Completed   Zoster Vaccines- Shingrix  Completed   HPV VACCINES  Aged Out    Health Maintenance  Health Maintenance Due  Topic Date Due   INFLUENZA VACCINE  06/14/2023   COVID-19 Vaccine (4 - 2024-25 season) 07/15/2023    Colorectal cancer screening: Type of screening: Colonoscopy. Completed 12/02/2019. Repeat every 10 years  Mammogram status: Completed March 2024. Repeat every year  Bone Density status: Completed 12/19/2022. Results reflect: Bone density results: OSTEOPENIA. Repeat every 2 years.  Lung Cancer Screening: (Low Dose CT Chest recommended if Age 52-80 years, 20 pack-year currently smoking OR have quit w/in 15years.) does not qualify.   Lung Cancer Screening  Referral: N/A  Additional Screening:  Hepatitis C Screening: does qualify; Completed 10/25/2020  Vision Screening: Recommended annual ophthalmology exams for early detection of glaucoma and other disorders of the eye. Is the patient up to date with their annual eye exam?  Yes  Who is the provider or what is the name of the office in which the patient attends annual eye exams? Pt cannot remember his name but does go annually If pt is not established with a provider, would they like to be referred to a provider to establish care?  N/A .   Dental Screening: Recommended annual dental exams for proper oral hygiene  Community Resource Referral / Chronic Care Management: CRR required this visit?  No   CCM required this visit?  No     Plan:     I have personally reviewed and noted the following in the patient's chart:   Medical and social history Use of  alcohol, tobacco or illicit drugs  Current medications and supplements including opioid prescriptions. Patient is not currently taking opioid prescriptions. Functional ability and status Nutritional status Physical activity Advanced directives List of other physicians Hospitalizations, surgeries, and ER visits in previous 12 months Vitals Screenings to include cognitive, depression, and falls Referrals and appointments  In addition, I have reviewed and discussed with patient certain preventive protocols, quality metrics, and best practice recommendations. A written personalized care plan for preventive services as well as general preventive health recommendations were provided to patient.     Karie Georges, MD   01/17/2024   After Visit Summary: (In Person-Printed) AVS printed and given to the patient

## 2024-01-12 LAB — COMPREHENSIVE METABOLIC PANEL
AG Ratio: 1.8 (calc) (ref 1.0–2.5)
ALT: 15 U/L (ref 6–29)
AST: 16 U/L (ref 10–35)
Albumin: 4.4 g/dL (ref 3.6–5.1)
Alkaline phosphatase (APISO): 64 U/L (ref 37–153)
BUN: 12 mg/dL (ref 7–25)
CO2: 26 mmol/L (ref 20–32)
Calcium: 9.3 mg/dL (ref 8.6–10.4)
Chloride: 101 mmol/L (ref 98–110)
Creat: 0.7 mg/dL (ref 0.50–1.05)
Globulin: 2.4 g/dL (ref 1.9–3.7)
Glucose, Bld: 89 mg/dL (ref 65–99)
Potassium: 4.2 mmol/L (ref 3.5–5.3)
Sodium: 137 mmol/L (ref 135–146)
Total Bilirubin: 0.4 mg/dL (ref 0.2–1.2)
Total Protein: 6.8 g/dL (ref 6.1–8.1)

## 2024-01-12 LAB — LIPID PANEL
Cholesterol: 233 mg/dL — ABNORMAL HIGH (ref <200)
HDL: 82 mg/dL (ref >=50)
LDL Cholesterol (Calc): 128 mg/dL — ABNORMAL HIGH
Non-HDL Cholesterol (Calc): 151 mg/dL — ABNORMAL HIGH (ref <130)
Total CHOL/HDL Ratio: 2.8 (calc) (ref <5.0)
Triglycerides: 119 mg/dL (ref <150)

## 2024-01-13 ENCOUNTER — Encounter: Payer: Self-pay | Admitting: Family Medicine

## 2024-01-16 ENCOUNTER — Telehealth (INDEPENDENT_AMBULATORY_CARE_PROVIDER_SITE_OTHER): Payer: Self-pay | Admitting: Otolaryngology

## 2024-01-16 NOTE — Progress Notes (Signed)
 PHQ-9 completed via phone call to the patient on 01/16/2024 as it did not appear in the notes from 01/11/2024.

## 2024-01-16 NOTE — Telephone Encounter (Signed)
 Reminder Call:  Confirmed time and location w/patient- [address] [place], [place] [address]

## 2024-01-17 ENCOUNTER — Ambulatory Visit (INDEPENDENT_AMBULATORY_CARE_PROVIDER_SITE_OTHER): Payer: Medicare Other | Admitting: Otolaryngology

## 2024-01-17 VITALS — BP 115/72 | HR 73 | Ht 64.5 in | Wt 152.0 lb

## 2024-01-17 DIAGNOSIS — H608X3 Other otitis externa, bilateral: Secondary | ICD-10-CM | POA: Diagnosis not present

## 2024-01-17 DIAGNOSIS — H903 Sensorineural hearing loss, bilateral: Secondary | ICD-10-CM | POA: Diagnosis not present

## 2024-01-20 DIAGNOSIS — H608X3 Other otitis externa, bilateral: Secondary | ICD-10-CM | POA: Insufficient documentation

## 2024-01-20 DIAGNOSIS — H903 Sensorineural hearing loss, bilateral: Secondary | ICD-10-CM | POA: Insufficient documentation

## 2024-01-20 NOTE — Progress Notes (Signed)
 Patient ID: Summer Craig, female   DOB: 07-24-1956, 68 y.o.   MRN: [id number]  Follow-up: Bilateral chronic eczematous otitis externa  HPI: The patient is a 68 year old female who returns today for her follow-up evaluation.  She was last seen in July 2024.  At that time, she was noted to have bilateral chronic eczematous otitis externa and bilateral mild high-frequency sensorineural hearing loss.  The patient returns today reporting occasional itchy sensation in her ears.  She was recently noted to have possible abnormality of her tympanic membrane.  She would like to have her ears examined.  She denies any change in her hearing.  Exam: General: Communicates without difficulty, well nourished, no acute distress. Head: Normocephalic, no evidence injury, no tenderness, facial buttresses intact without stepoff. Face/sinus: No tenderness to palpation and percussion. Facial movement is normal and symmetric. Eyes: PERRL, EOMI. No scleral icterus, conjunctivae clear. Neuro: CN II exam reveals vision grossly intact.  No nystagmus at any point of gaze. Ears: Auricles well formed without lesions.  Ear canals are intact with eczematous changes bilaterally.  The TMs are intact without fluid. Nose: External evaluation reveals normal support and skin without lesions.  Dorsum is intact.  Anterior rhinoscopy reveals congested mucosa over anterior aspect of inferior turbinates and intact septum.  No purulence noted. Oral:  Oral cavity and oropharynx are intact, symmetric, without erythema or edema.  Mucosa is moist without lesions. Neck: Full range of motion without pain.  There is no significant lymphadenopathy.  No masses palpable.  Thyroid bed within normal limits to palpation.  Parotid glands and submandibular glands equal bilaterally without mass.  Trachea is midline. Neuro:  CN 2-12 grossly intact.   Assessment: 1.  Bilateral chronic eczematous otitis externa. 2.  Her tympanic membranes and middle ear spaces are  normal. 3.  Subjectively stable bilateral high-frequency sensorineural hearing loss.  Plan: 1.  The physical exam findings are reviewed with the patient. 2.  The patient is reassured that no TM abnormality is noted today. 3.  Elocon cream to treat the chronic eczematous otitis externa. 4.  The patient is encouraged to call with any questions or concerns.

## 2024-01-22 ENCOUNTER — Ambulatory Visit: Payer: Medicare Other | Admitting: Family Medicine

## 2024-01-25 IMAGING — CT CT ABD-PELV W/ CM
2 of 5 series · 16 of 46 positions shown, 18 images · IV contrast (OMNIPAQUE)
Comparison: 08/12/2019.

CLINICAL DATA: Uterine/cervical cancer, staging.

EXAM:
CT ABDOMEN AND PELVIS WITH CONTRAST
TECHNIQUE: Multidetector CT imaging of the abdomen and pelvis was performed
using the standard protocol following bolus administration of
intravenous contrast.

[Series 2: axial st · axial · 0.80mm/px · z∈[-340,+45]mm · 13 of 89 slices shown, 15 images]
[im 6/89  soft-tissue]
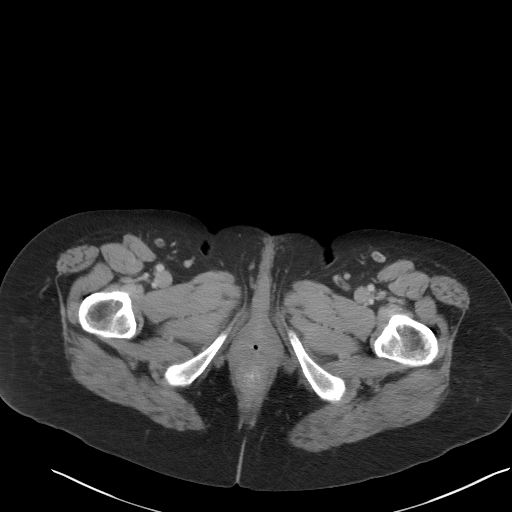
[im 6/89  bone]
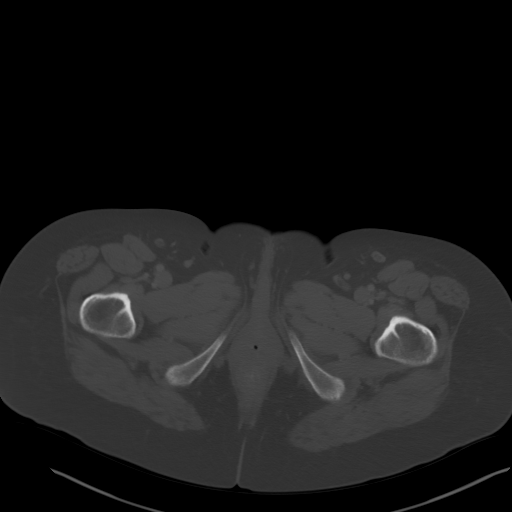
[im 12/89  soft-tissue]
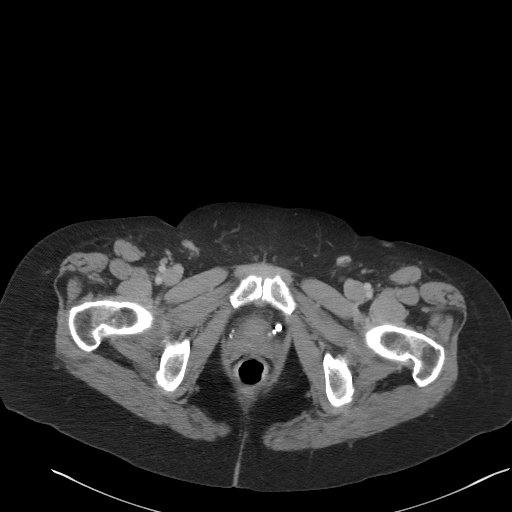
[im 18/89  soft-tissue]
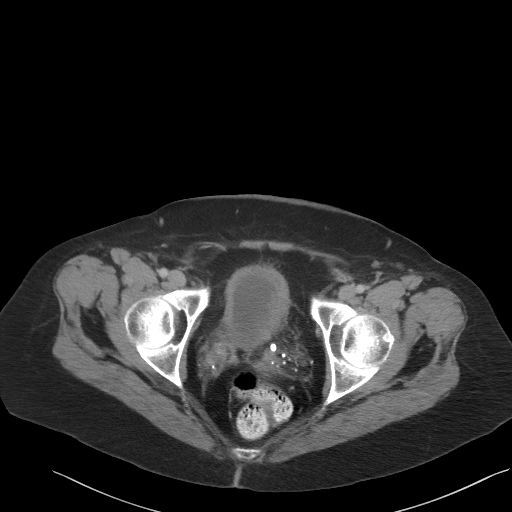
[im 24/89  soft-tissue]
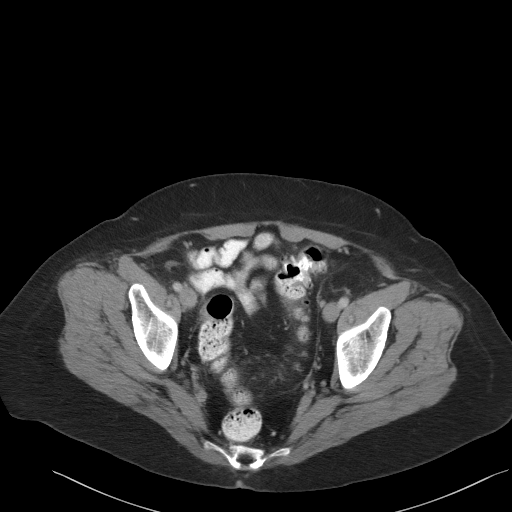
[im 30/89  soft-tissue]
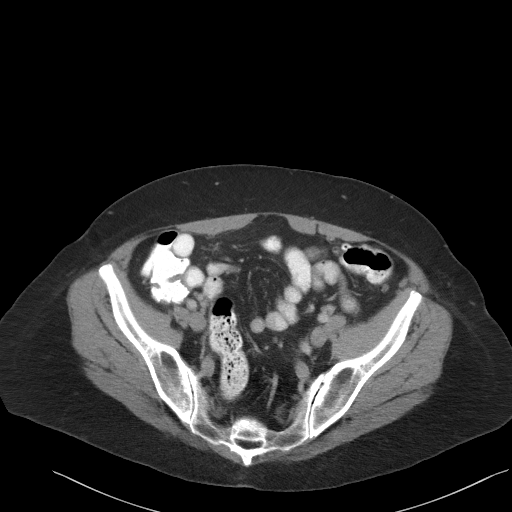
[im 36/89  soft-tissue]
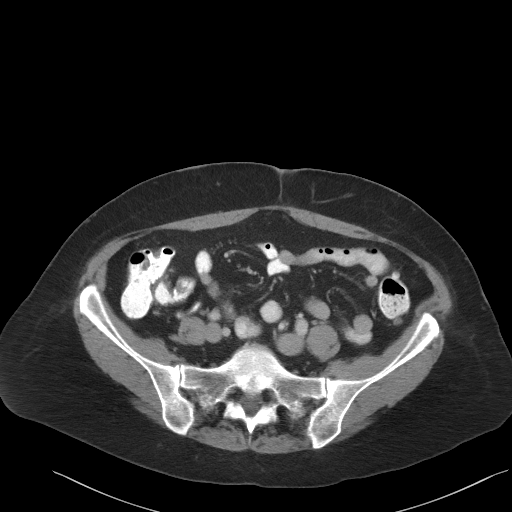
[im 47/89  soft-tissue]
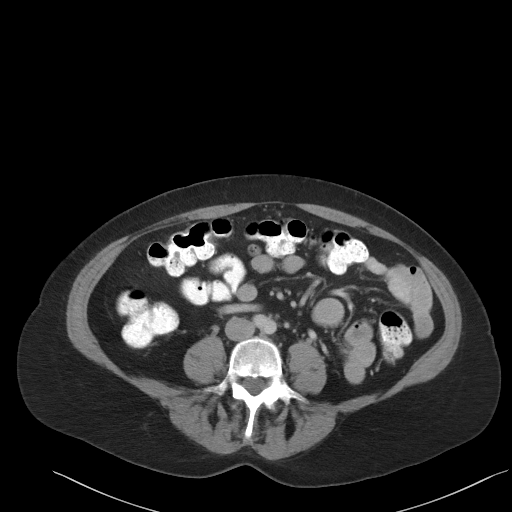
[im 53/89  soft-tissue]
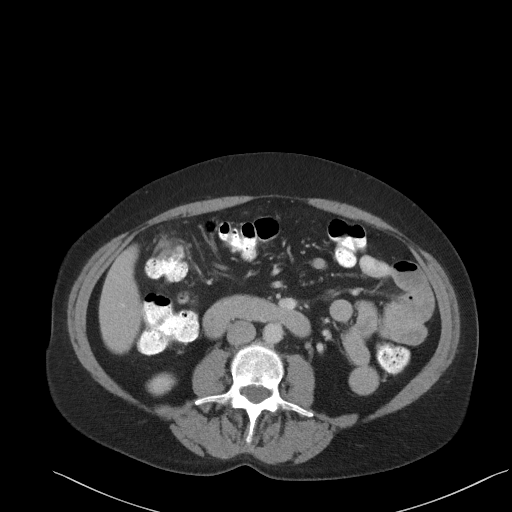
[im 59/89  soft-tissue]
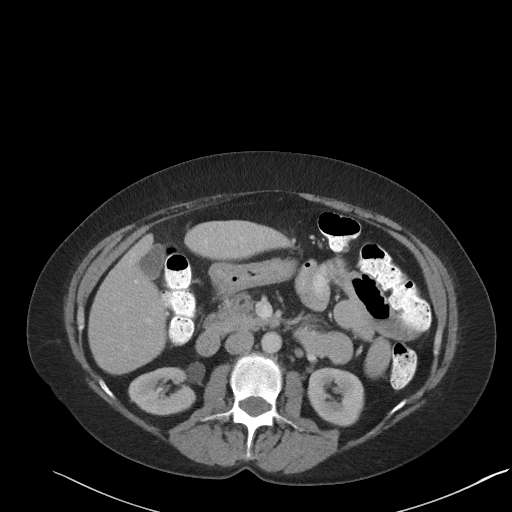
[im 59/89  bone]
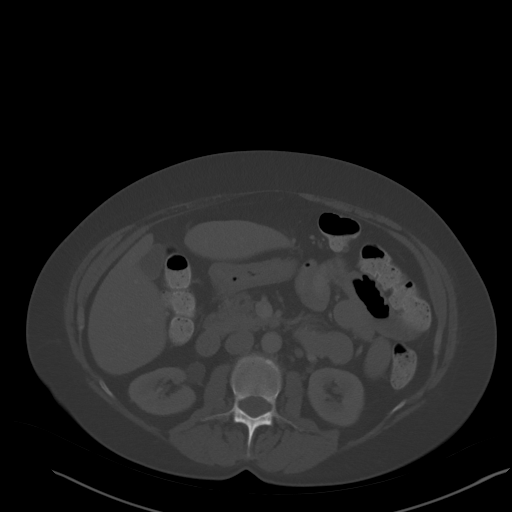
[im 65/89  soft-tissue]
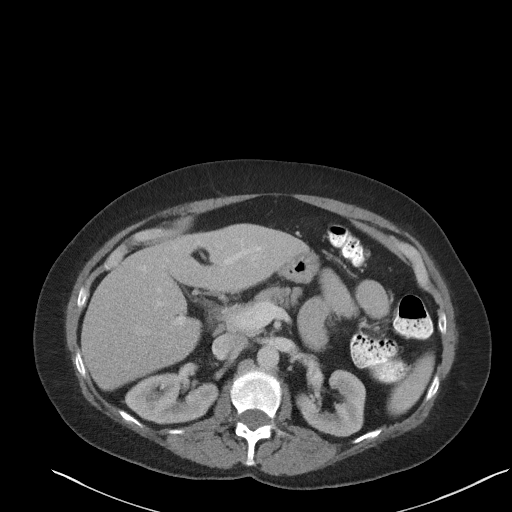
[im 71/89  soft-tissue]
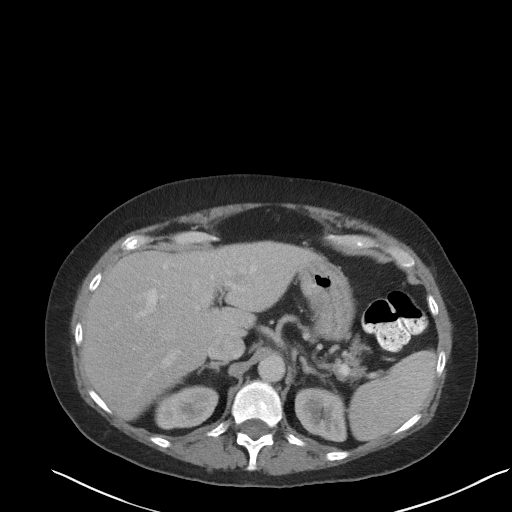
[im 77/89  soft-tissue]
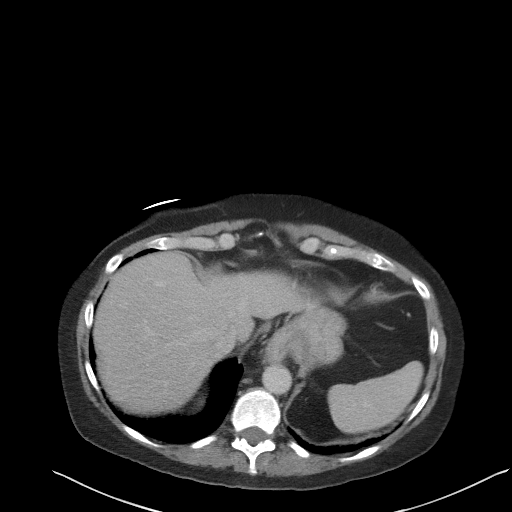
[im 83/89  soft-tissue]
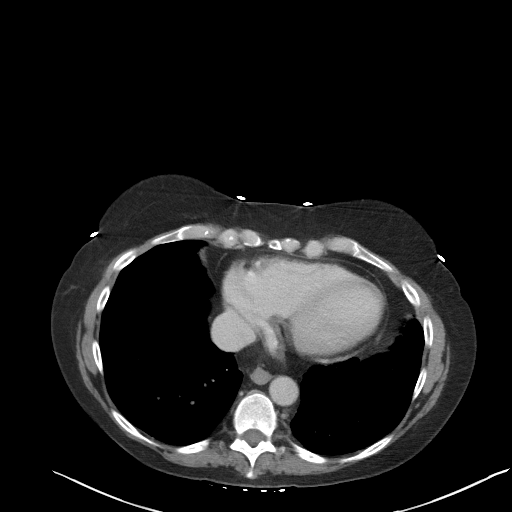

[Series 5: coronal st · coronal · 0.80mm/px · 3 of 77 slices shown]
[im 26/77  soft-tissue]
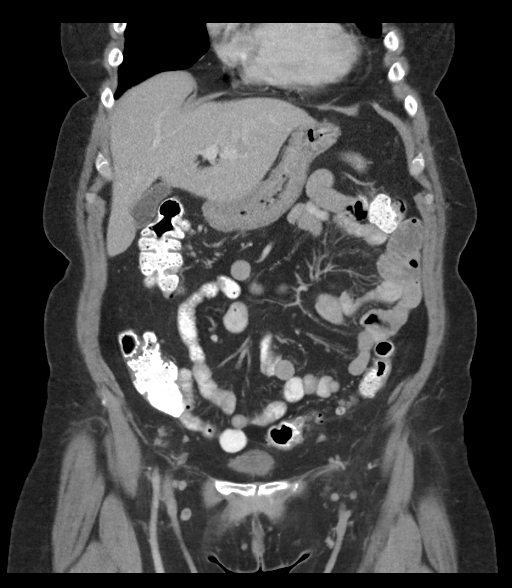
[im 34/77  soft-tissue]
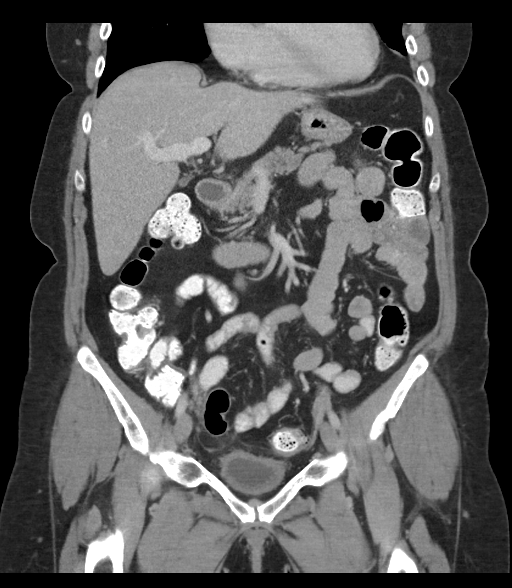
[im 43/77  soft-tissue]
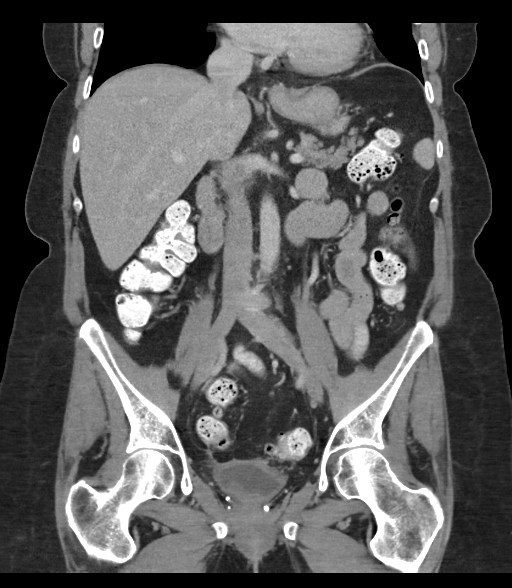

[16 of 46 positions shown; findings below may reference images not displayed]

RADIATION DOSE REDUCTION: This exam was performed according to the
departmental dose-optimization program which includes automated
exposure control, adjustment of the mA and/or kV according to
patient size and/or use of iterative reconstruction technique.

CONTRAST:  100mL OMNIPAQUE IOHEXOL 300 MG/ML  SOLN
FINDINGS: Lower chest: Mild atelectasis is present at the lung bases.

Hepatobiliary: There is a hypodense lesion with heterogeneous
enhancement in the left lobe of the liver, hepatic segment 2,
measuring 1.6 cm which is not seen on delayed images, likely
representing a hemangioma. No biliary ductal dilatation. The
gallbladder is without stones.

Pancreas: Unremarkable. No pancreatic ductal dilatation or
surrounding inflammatory changes.

Spleen: Normal in size without focal abnormality.

Adrenals/Urinary Tract: The adrenal glands are within normal limits.
The kidneys enhance symmetrically. No renal calculus or
hydronephrosis. There is mild fullness of the right renal pelvis,
likely extrarenal pelvis. Diffuse bladder wall thickening is noted.

Stomach/Bowel: There is a small hiatal hernia. No bowel obstruction,
free air, or pneumatosis. Scattered diverticula are present along
the colon. There is mild bowel wall thickening with surrounding fat
stranding at the sigmoid colon and the hepatic flexure. A normal
appendix is present in the right lower quadrant.

Vascular/Lymphatic: The aorta is normal in caliber. Enlarged lymph
node is present along the iliac chain on the left measuring up to
1.1 cm in short axis diameter and unchanged from 6262.

Reproductive: Status post hysterectomy. No adnexal masses.

Other: A fat containing umbilical hernia is noted. No ascites. A fat
containing inguinal hernias present on the right.

Musculoskeletal: No acute or suspicious osseous abnormality.
IMPRESSION: 1. Status post hysterectomy with no definite evidence of metastatic
disease.
2. Colonic diverticulosis with bowel wall thickening and surrounding
fat stranding involving the sigmoid colon and hepatic flexure
suggesting diverticulitis.
3. Diffuse bladder wall thickening, possible infectious or
inflammatory cystitis.
4. Hepatic hemangioma.
5. Small hiatal hernia.

## 2024-02-12 DIAGNOSIS — F3175 Bipolar disorder, in partial remission, most recent episode depressed: Secondary | ICD-10-CM | POA: Diagnosis not present

## 2024-02-19 ENCOUNTER — Ambulatory Visit (INDEPENDENT_AMBULATORY_CARE_PROVIDER_SITE_OTHER): Admitting: Family Medicine

## 2024-02-19 ENCOUNTER — Encounter: Payer: Medicare Other | Admitting: Family Medicine

## 2024-02-19 ENCOUNTER — Encounter: Payer: Self-pay | Admitting: Family Medicine

## 2024-02-19 VITALS — BP 132/80 | HR 65 | Temp 97.6°F | Resp 16 | Ht 64.5 in | Wt 155.0 lb

## 2024-02-19 DIAGNOSIS — R7301 Impaired fasting glucose: Secondary | ICD-10-CM

## 2024-02-19 DIAGNOSIS — C541 Malignant neoplasm of endometrium: Secondary | ICD-10-CM

## 2024-02-19 DIAGNOSIS — E785 Hyperlipidemia, unspecified: Secondary | ICD-10-CM | POA: Insufficient documentation

## 2024-02-19 DIAGNOSIS — F3170 Bipolar disorder, currently in remission, most recent episode unspecified: Secondary | ICD-10-CM

## 2024-02-19 NOTE — Assessment & Plan Note (Signed)
 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 She follows with Dr. Pricilla Holm every 6 months.

## 2024-02-19 NOTE — Assessment & Plan Note (Signed)
 Reporting problem as well-controlled. Follows with psychiatrist every 6 months.

## 2024-02-19 NOTE — Assessment & Plan Note (Addendum)
 Encouraged consistency with a healthy lifestyle for diabetes prevention. Last hemoglobin A1c in normal range, 5.6 and FG 89 She is on Glucophage XR 500 mg 2 tablet twice daily for weight loss, which I do not prescribed. She does not want to decrease dose or discontinue medication but she is positive that her gynecology will continue prescribing med for this purpose.  We discussed some side effects of metformin in general. Recommend B12 supplementation 2000 mcg once per week.

## 2024-02-19 NOTE — Progress Notes (Signed)
 HPI: Summer Craig is a 68 y.o. female with a PMHx significant for diverticulosis, endometrial cancer, impaired fasting glucose, bipolar disorder, and hiatal hernia, among some, who is here today to establish care.  Former PCP: Vinetta Bergamo. Casimiro Needle, MD Last preventive routine visit: 01/11/2024  Exercise: Patient states she walks regularly.  Diet: She mainly cooks at home and eats vegetables most days.  Sleep: She sleeps 8-9 hours per night.  Alcohol Use: She drinks socially, around one drink per week.  Smoking: none Vision: UTD on routine vision care.  Dental: UTD on routine dental care.   Chronic medical problems:   Hiatal hernia:  She has taken Nexium for acid reflux in the past, but is now only taking it as needed.   Bipolar Disorder: dx'ed in her mid thirties Currently on Olanzapine 2.5 mg at bedtime and Fluvoxamine 50 mg twice daily, and Lamictal 200 mg daily. She sees her psychiatrist twice per year. She reports problem is well-controlled.  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 She follows with gynecology oncologist every 6 months, Dr. Pricilla Holm.  Makes urinary incontinence: She follows with urologist, Dr. Olena Leatherwood.  -Currently on Metformin ER 500 mg twice daily for weight loss. She has been on the Metformin since she started taking Olanzapine in her thirties.  Hx of IFG.  Lab Results  Component Value Date   HGBA1C 5.6 01/11/2024   Lab Results  Component Value Date   NA 137 01/11/2024   CL 101 01/11/2024   K 4.2 01/11/2024   CO2 26 01/11/2024   BUN 12 01/11/2024   CREATININE 0.70 01/11/2024   GFR 89.48 12/12/2022   CALCIUM 9.3 01/11/2024   ALBUMIN 4.5 12/12/2022   GLUCOSE 89 01/11/2024   She follows regularly with gynecology.  Currently on estradiol 0.5 g vaginally  twice per week.   She states that she had blood work done recently and would like to go through results.  Hyperlipidemia: Currently she is on nonpharmacologic treatment. Lab Results  Component Value Date   CHOL 233 (H) 01/11/2024   HDL 82 01/11/2024   LDLCALC 128 (H) 01/11/2024   TRIG 119 01/11/2024   CHOLHDL 2.8 01/11/2024   Osteopenia:  Last DEXA scan was done on 12/19/2022.  FRAX score: 10 year major osteoporotic risk: 9.4%. 10 year hip fracture risk: 1.1%.  She is not sure about the amount of calcium and vitamin D supplementation she should be taking.  Review of Systems  Constitutional:  Negative for activity change, appetite change and fever.  HENT:  Negative for mouth sores and sore throat.   Eyes:  Negative for redness and visual disturbance.  Respiratory:  Negative for cough and shortness of breath.   Cardiovascular:  Negative for chest pain, palpitations and leg swelling.  Gastrointestinal:  Negative for abdominal pain, nausea and vomiting.  Endocrine: Negative for cold intolerance, heat intolerance, polydipsia, polyphagia and polyuria.  Genitourinary:  Negative for decreased urine volume, dysuria and hematuria.  Skin:  Negative for rash.  Neurological:  Negative for syncope, weakness and headaches.  Psychiatric/Behavioral:  Negative for confusion and hallucinations.   See other pertinent positives and negatives in HPI.  Current Outpatient Medications on File Prior to Visit  Medication Sig Dispense Refill   Biotin 5000 MCG TABS Take by mouth.     estradiol (ESTRACE) 0.1 MG/GM vaginal cream Place 0.5 g  vaginally 2 (two) times a week. Place 0.5g nightly for two weeks then twice a week after 30 g 3   fluvoxaMINE (LUVOX) 50 MG tablet Take 1 tablet (50 mg total) by mouth 2 (two) times daily.  1   lamoTRIgine (LAMICTAL) 200 MG tablet Take 200 mg by mouth daily.  1   metFORMIN (GLUCOPHAGE-XR) 500 MG 24 hr tablet Take 1 tablet (500 mg total) by mouth 2 (two) times daily. 180  tablet 2   Multiple Vitamin (MULTIVITAMIN WITH MINERALS) TABS tablet Take 1 tablet by mouth daily.     OLANZapine (ZYPREXA) 2.5 MG tablet Take 2.5 mg by mouth at bedtime.  0   Wheat Dextrin (BENEFIBER) CHEW Chew by mouth.     No current facility-administered medications on file prior to visit.   Past Medical History:  Diagnosis Date   Arthritis    both knees   Bipolar disorder (HCC)    Colon polyp    Depression    Diverticulitis 2021   DVT (deep venous thrombosis) (HCC)    bunion surgery- post surgery- per patient. Was on blood thinners for short period. More than 10 years ago per pt on 01/10/22.   Dysplasia of cervix, low grade (CIN 1) 12/14/2011   LEEP procedures x 2   Genital warts    GERD (gastroesophageal reflux disease)    nexium as needed   Herpes genitalia    History of hiatal hernia    Pre-diabetes    Patient takes Metformin and Ozempic to help with blood sugar and weight loss.   Urine incontinence    Uterine cancer (HCC)    found in cell walls at hysterectomy   No Known Allergies  Family History  Problem Relation Age of Onset   Sleep apnea Mother    Arthritis Mother    Alzheimer's disease Mother    Brain cancer Father    Alzheimer's disease Maternal Grandmother    Colon cancer Neg Hx    Breast cancer Neg Hx    Ovarian cancer Neg Hx    Endometrial cancer Neg Hx    Pancreatic cancer Neg Hx    Prostate cancer Neg Hx    Bladder Cancer Neg Hx    Uterine cancer Neg Hx    Social History   Socioeconomic History   Marital status: Divorced    Spouse name: Not on file   Number of children: Not on file   Years of education: Not on file   Highest education level: Bachelor's degree (e.g., BA, AB, BS)  Occupational History   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)  Tobacco Use   Smoking status: Never   Smokeless tobacco: Never  Vaping Use   Vaping status: Never Used  Substance and Sexual Activity   Alcohol use: Yes    Alcohol/week: 1.0 standard drink  of alcohol    Types: 1 Glasses of wine per week    Comment: socially - infrequent   Drug use: Never   Sexual activity: Not Currently    Partners: Male    Birth control/protection: Post-menopausal  Other Topics Concern   Not on file  Social History Narrative   Not on file   Social Drivers of Health   Financial Resource Strain: Low Risk  (01/07/2024)   Overall Financial Resource Strain (CARDIA)    Difficulty of Paying Living Expenses: Not hard at all  Food Insecurity: No Food Insecurity (01/07/2024)   Hunger Vital Sign    Worried About Running Out of  Food in the Last Year: Never true    Ran Out of Food in the Last Year: Never true  Transportation Needs: No Transportation Needs (01/07/2024)   PRAPARE - Administrator, Civil Service (Medical): No    Lack of Transportation (Non-Medical): No  Physical Activity: Sufficiently Active (01/07/2024)   Exercise Vital Sign    Days of Exercise per Week: 7 days    Minutes of Exercise per Session: 30 min  Stress: No Stress Concern Present (01/07/2024)   [employer] of Occupational Health - Occupational Stress Questionnaire    Feeling of Stress : Only a little  Social Connections: Moderately Integrated (01/07/2024)   Social Connection and Isolation Panel [NHANES]    Frequency of Communication with Friends and Family: More than three times a week    Frequency of Social Gatherings with Friends and Family: Twice a week    Attends Religious Services: More than 4 times per year    Active Member of [employer] or Organizations: Yes    Attends Banker Meetings: More than 4 times per year    Marital Status: Divorced    Vitals:   02/19/24 1545  BP: 132/80  Pulse: 65  Resp: 16  Temp: 97.6 F (36.4 C)  SpO2: 99%   Body mass index is 26.19 kg/m.  Physical Exam Vitals and nursing note reviewed.  Constitutional:      General: She is not in acute distress.    Appearance: She is well-developed.  HENT:     Head:  Normocephalic and atraumatic.     Mouth/Throat:     Mouth: Mucous membranes are moist.     Pharynx: Oropharynx is clear. Uvula midline.  Eyes:     Conjunctiva/sclera: Conjunctivae normal.  Cardiovascular:     Rate and Rhythm: Normal rate and regular rhythm.     Pulses:          Posterior tibial pulses are 2+ on the right side and 2+ on the left side.     Heart sounds: No murmur heard. Pulmonary:     Effort: Pulmonary effort is normal. No respiratory distress.     Breath sounds: Normal breath sounds.  Abdominal:     Palpations: Abdomen is soft. There is no hepatomegaly or mass.     Tenderness: There is no abdominal tenderness.  Musculoskeletal:     Right lower leg: No edema.     Left lower leg: No edema.  Lymphadenopathy:     Cervical: No cervical adenopathy.  Skin:    General: Skin is warm.     Findings: No erythema or rash.  Neurological:     General: No focal deficit present.     Mental Status: She is alert and oriented to person, place, and time.     Cranial Nerves: No cranial nerve deficit.     Gait: Gait normal.  Psychiatric:        Mood and Affect: Mood and affect normal.   ASSESSMENT AND PLAN:  Ms. Tugwell was seen today to establish care.   Impaired fasting glucose Assessment & Plan: Encouraged consistency with a healthy lifestyle for diabetes prevention. Last hemoglobin A1c in normal range, 5.6 and FG 89 She is on Glucophage XR 500 mg 2 tablet twice daily for weight loss, which I do not prescribed. She does not want to decrease dose or discontinue medication but she is positive that her gynecology will continue prescribing med for this purpose.  We discussed some side effects of metformin  in general. Recommend B12 supplementation 2000 mcg once per week.   Hyperlipidemia, unspecified hyperlipidemia type Assessment & Plan: We discussed current recommendations for pharmacologic treatment of hyperlipidemia. 10 years ASCVD risk 6.1%. Continue nonpharmacologic  treatment. Fasting lipid panel can be repeated in 12/2024.   Endometrial cancer [facility]) Assessment & Plan: Incidental diagnosis after hysterectomy due to intermittent cervical dysplasia and persistent HPV infection. She follows with Dr. Pricilla Holm every 6 months.   Bipolar disorder in full remission, most recent episode unspecified type [facility]) Assessment & Plan: Reporting problem as well-controlled. Follows with psychiatrist every 6 months.   Return in about 47 weeks (around 01/13/2025) for CPE. Fasting labs a week before.. FLP.HgA1C,CMP.  I, Rolla Etienne Wierda, acting as a scribe for Somer Trotter [place], MD., have documented all relevant documentation on the behalf of Cecilio Ohlrich [place], MD, as directed by  Shye Doty [place], MD while in the presence of Cindy Brindisi [place], MD.   I, Lillyona Polasek [place], MD, have reviewed all documentation for this visit. The documentation on 02/19/24 for the exam, diagnosis, procedures, and orders are all accurate and complete.  Nur Krasinski G. [place], MD  [facility] [place] office.

## 2024-02-19 NOTE — Patient Instructions (Addendum)
 A few things to remember from today's visit:  Impaired fasting glucose  Hyperlipidemia, unspecified hyperlipidemia type  Over the counter B12 2000 mcg once per week. Calcium recommendations: 1200 mg daily, 600 mg 2 times daily and vit D 800 U daily.  The 10-year ASCVD risk score (Arnett DK, et al., 2019) is: 6.1%   Values used to calculate the score:     Age: 68 years     Sex: Female     Is Non-Hispanic African American: No     Diabetic: No     Tobacco smoker: No     Systolic Blood Pressure: 132 mmHg     Is BP treated: No     HDL Cholesterol: 82 mg/dL     Total Cholesterol: 233 mg/dL  If you need refills for medications you take chronically, please call your pharmacy. Do not use My Chart to request refills or for acute issues that need immediate attention. If you send a my chart message, it may take a few days to be addressed, specially if I am not in the office.  Please be sure medication list is accurate. If a new problem present, please set up appointment sooner than planned today.

## 2024-02-19 NOTE — Assessment & Plan Note (Signed)
 We discussed current recommendations for pharmacologic treatment of hyperlipidemia. 10 years ASCVD risk 6.1%. Continue nonpharmacologic treatment. Fasting lipid panel can be repeated in 12/2024.

## 2024-02-26 ENCOUNTER — Ambulatory Visit: Payer: Medicare Other | Admitting: Obstetrics

## 2024-03-03 ENCOUNTER — Ambulatory Visit: Admitting: Obstetrics

## 2024-03-03 ENCOUNTER — Encounter: Payer: Self-pay | Admitting: Obstetrics

## 2024-03-03 VITALS — BP 97/63 | HR 57

## 2024-03-03 DIAGNOSIS — N3946 Mixed incontinence: Secondary | ICD-10-CM | POA: Diagnosis not present

## 2024-03-03 DIAGNOSIS — R159 Full incontinence of feces: Secondary | ICD-10-CM

## 2024-03-03 DIAGNOSIS — N952 Postmenopausal atrophic vaginitis: Secondary | ICD-10-CM

## 2024-03-03 DIAGNOSIS — R8290 Unspecified abnormal findings in urine: Secondary | ICD-10-CM | POA: Diagnosis not present

## 2024-03-03 MED ORDER — TROSPIUM CHLORIDE 20 MG PO TABS: 20.0000 mg | ORAL_TABLET | Freq: Two times a day (BID) | ORAL | 0 refills | Status: DC

## 2024-03-03 MED ORDER — ESTRADIOL 0.1 MG/GM VA CREA: 1.0000 g | TOPICAL_CREAM | VAGINAL | 3 refills | Status: AC

## 2024-03-03 NOTE — Assessment & Plan Note (Signed)
-   12/25/23 POCT UA + heme, negative clean catch UA micro and culture - denies tobacco use or pelvic radiation

## 2024-03-03 NOTE — Progress Notes (Signed)
 [facility] Return Visit  SUBJECTIVE  History of Present Illness: Summer Craig is a 68 y.o. female seen in follow-up for mixed urinary incontinence, fecal incontinence, vaginal atrophy, and abnormal urinalysis. Plan at last visit was vaginal estrogen, Kegel exercises.   Reports 50% improvement in symptoms Reduced pad thickness from 2 to 1, with occasional  #2 pad usage.  Reduced fluid intake from 160oz to 60oz/day Urgency urinary leakage 0x/week down from 6-10x/week due to decreased sensation with larger volume leakage, attributed relief to 6 weeks after starting Kegel exercises and vaginal estrogen Leaks 5x/wk with coughing/sneezing when her bladder is full Pad use: 1 liners/ mini-pads per day.    Gemtesa  cost prohibitive ($400/month), unable to tell a difference with 4 weeks of use.  Did not start trial of IBGuard, reports 1 episode of bowel leakage since last visit. Reports doing better with Benefiber in the daytime instead of bedtime dosing Topical estrogen 2x/week with relief  Past Medical History: Patient  has a past medical history of Arthritis, Bipolar disorder (HCC), Colon polyp, Depression, Diverticulitis (2021), DVT (deep venous thrombosis) (HCC), Dysplasia of cervix, low grade (CIN 1) (12/14/2011), Genital warts, GERD (gastroesophageal reflux disease), Herpes genitalia, History of hiatal hernia, Pre-diabetes, Urine incontinence, and Uterine cancer (HCC).   Past Surgical History: She  has a past surgical history that includes bunion removal (Left); Tonsillectomy and adenoidectomy; Bunionectomy (Right); Knee arthroscopy (Left, 2022); Colonoscopy w/ polypectomy; Colonoscopy (12/02/2019); LEFT MEDIAL DISPLACEMENT CALCANEAL OSTEOTOMY, POSTERIOR TIBIAL TENDON DEBRIDEMENT AND TENODESIS (11/09/2020); FLEXOR DIGITORUM LONGUS TRANSFER, SPRING LIGAMENT RECONSTRUCTION, POSSIBLE TENDO ACHILLES LENGTHENING (Left  (11/09/2020); Arthrodesis foot with weil osteotomy (Left,  11/09/2020); and Robotic assisted total hysterectomy with bilateral salpingo oophorectomy (Bilateral, 01/16/2022).   Medications: She has a current medication list which includes the following prescription(s): biotin, fluvoxamine , lamotrigine , metformin , multivitamin with minerals, olanzapine , trospium , benefiber, and estradiol .   Allergies: Patient has no known allergies.   Social History: Patient  reports that she has never smoked. She has never used smokeless tobacco. She reports current alcohol use of about 1.0 standard drink of alcohol per week. She reports that she does not use drugs.     OBJECTIVE     Physical Exam: Vitals:   03/03/24 1111  BP: 97/63  Pulse: (!) 57   Gen: No apparent distress, A&O x 3.  Detailed Urogynecologic Evaluation:  Deferred.       ASSESSMENT AND PLAN    Summer Craig is a 67 y.o. with:  1. Urinary incontinence, mixed   2. Vaginal atrophy   3. Incontinence of feces, unspecified fecal incontinence type   4. Abnormal urinalysis     Urinary incontinence, mixed Assessment & Plan: - 12/2023 POCT + heme, negative UA microscopy and culture - urgency > stress, onset after left ankle injury and surgery. Resolved after fluid management, Kegels, and vaginal estrogen - failed pelvic floor PT and mirabegron in the past - We discussed the symptoms of overactive bladder (OAB), which include urinary urgency, urinary frequency, nocturia, with or without urge incontinence.  While we do not know the exact etiology of OAB, several treatment options exist. We discussed management including behavioral therapy (decreasing bladder irritants, urge suppression strategies, timed voids, bladder retraining), physical therapy, medication; for refractory cases posterior tibial nerve stimulation, sacral neuromodulation, and intravesical botulinum toxin injection.  For anticholinergic medications, we discussed the potential side effects of anticholinergics including dry eyes, dry  mouth, constipation, cognitive impairment and urinary retention. For Beta-3 agonist medication, we discussed the potential side  effect of elevated blood pressure which is more likely to occur in individuals with uncontrolled hypertension. - trial of gemtesa  - continue behavioral modification with fluid management - For treatment of stress urinary incontinence,  non-surgical options include expectant management, weight loss, physical therapy, as well as a pessary.  Surgical options include a midurethral sling, Burch urethropexy, and transurethral injection of a bulking agent. - continue vaginal estrogen, increased to 1g 3x/wk - unclear relief with Gemtesa  due to 4 week use, discussed possible repeat trial with samples if persistent symptoms and Rx to [place] pharmacy due to cost.  - Rx for trial of Trospium  due to bowel leakage and urgency urinary leakage, reviewed cost with insurance and pt to sign up for [facility] if cost prohibitive.  - Discussed possible repeat trial of mirabegron  - pt desires to avoid 3rd line therapy for OAB or procedural interventions.  Orders: -     Trospium  Chloride; Take 1 tablet (20 mg total) by mouth 2 (two) times daily.  Dispense: 60 tablet; Refill: 0 -     Estradiol ; Place 1 g vaginally 3 (three) times a week. Place 0.5g nightly for two weeks then twice a week after  Dispense: 30 g; Refill: 3  Vaginal atrophy Assessment & Plan: - For symptomatic vaginal atrophy options include lubrication with a water-based lubricant, personal hygiene measures and barrier protection against wetness, and estrogen replacement in the form of vaginal cream, vaginal tablets, or a time-released vaginal ring.   - increase vaginal estrogen to 1g 3x/week to reassess symptoms   Orders: -     Estradiol ; Place 1 g vaginally 3 (three) times a week. Place 0.5g nightly for two weeks then twice a week after  Dispense: 30 g; Refill: 3  Incontinence of feces, unspecified fecal incontinence  type Assessment & Plan: - 5 episodes/year with liquid stool, 1 since last visit in around 2 months - reports reduced symptoms when she takes benefiber in the daytime compared to night time - denies IBS, reports spinach as dietary trigger - Treatment options include anti-diarrhea medication (loperamide/ Imodium OTC or prescription lomotil), fiber supplements, physical therapy, and possible sacral neuromodulation or surgery.   - continue Kegel exercises and diary to record episodes - encouraged trial of IBGuard with samples provided if symptoms persist or worsens   Abnormal urinalysis Assessment & Plan: - 12/25/23 POCT UA + heme, negative clean catch UA micro and culture - denies tobacco use or pelvic radiation   Time spent: I spent 22 minutes dedicated to the care of this patient on the date of this encounter to include pre-visit review of records, face-to-face time with the patient discussing mixed urinary incontinence, vaginal atrophy, fecal incontinence, abnormal urinalysis, and post visit documentation and ordering medication/ testing.   Darlene Ehlers, MD

## 2024-03-03 NOTE — Assessment & Plan Note (Signed)
-   5 episodes/year with liquid stool, 1 since last visit in around 2 months - reports reduced symptoms when she takes benefiber in the daytime compared to night time - denies IBS, reports spinach as dietary trigger - Treatment options include anti-diarrhea medication (loperamide/ Imodium OTC or prescription lomotil), fiber supplements, physical therapy, and possible sacral neuromodulation or surgery.   - continue Kegel exercises and diary to record episodes - encouraged trial of IBGuard with samples provided if symptoms persist or worsens

## 2024-03-03 NOTE — Assessment & Plan Note (Addendum)
-   12/2023 POCT + heme, negative UA microscopy and culture - urgency > stress, onset after left ankle injury and surgery. Resolved after fluid management, Kegels, and vaginal estrogen - failed pelvic floor PT and mirabegron in the past - We discussed the symptoms of overactive bladder (OAB), which include urinary urgency, urinary frequency, nocturia, with or without urge incontinence.  While we do not know the exact etiology of OAB, several treatment options exist. We discussed management including behavioral therapy (decreasing bladder irritants, urge suppression strategies, timed voids, bladder retraining), physical therapy, medication; for refractory cases posterior tibial nerve stimulation, sacral neuromodulation, and intravesical botulinum toxin injection.  For anticholinergic medications, we discussed the potential side effects of anticholinergics including dry eyes, dry mouth, constipation, cognitive impairment and urinary retention. For Beta-3 agonist medication, we discussed the potential side effect of elevated blood pressure which is more likely to occur in individuals with uncontrolled hypertension. - trial of gemtesa  - continue behavioral modification with fluid management - For treatment of stress urinary incontinence,  non-surgical options include expectant management, weight loss, physical therapy, as well as a pessary.  Surgical options include a midurethral sling, Burch urethropexy, and transurethral injection of a bulking agent. - continue vaginal estrogen, increased to 1g 3x/wk - unclear relief with Gemtesa  due to 4 week use, discussed possible repeat trial with samples if persistent symptoms and Rx to [place] pharmacy due to cost.  - Rx for trial of Trospium  due to bowel leakage and urgency urinary leakage, reviewed cost with insurance and pt to sign up for [facility] if cost prohibitive.  - Discussed possible repeat trial of mirabegron  - pt desires to avoid 3rd line therapy for OAB  or procedural interventions.

## 2024-03-03 NOTE — Patient Instructions (Signed)
 We discussed the symptoms of overactive bladder (OAB), which include urinary urgency, urinary frequency, night-time urination, with or without urge incontinence.  We discussed management including behavioral therapy (decreasing bladder irritants by following a bladder diet, urge suppression strategies, timed voids, bladder retraining), physical therapy, medication; and for refractory cases posterior tibial nerve stimulation, sacral neuromodulation, and intravesical botulinum toxin injection.   For anticholinergic medications, we discussed the potential side effects of anticholinergics including dry eyes, dry mouth, constipation, rare risks of cognitive impairment and urinary retention. You were given prescription for Trospium .  It can take a month to start working so give it time, but if you have bothersome side effects call sooner and we can try a different medication.  Call us  if you have trouble filling the prescription or if it's not covered by your insurance.  Please visit the website below to sign up for an account. We will need an email address to send along with your prescription to verify your prescription once you have signed up.  [email or web address]  Trospium  Chloride ER Capsule Extended Release  60mg   30 count $31.11  Increase vaginal estrogen therapy to 3 times weekly at night. This can be placed with your finger or an applicator inside the vagina and around the urethra.  Please let us  know if the prescription is too expensive and we can look for alternative options.   Is vaginal estrogen therapy safe for me? Vaginal estrogen preparations act on the vaginal skin, and only a very tiny amount is absorbed into the bloodstream (0.01%).  They work in a similar way to hand or face cream.  There is minimal absorption and they are therefore perfectly safe. If you have had breast cancer and have persistent troublesome symptoms which aren't settling with vaginal  moisturisers and lubricants, local estrogen treatment may be a possibility, but consultation with your oncologist should take place first.

## 2024-03-03 NOTE — Assessment & Plan Note (Signed)
-   For symptomatic vaginal atrophy options include lubrication with a water-based lubricant, personal hygiene measures and barrier protection against wetness, and estrogen replacement in the form of vaginal cream, vaginal tablets, or a time-released vaginal ring.   - increase vaginal estrogen to 1g 3x/week to reassess symptoms

## 2024-03-29 ENCOUNTER — Other Ambulatory Visit: Payer: Self-pay | Admitting: Obstetrics

## 2024-03-29 DIAGNOSIS — N3946 Mixed incontinence: Secondary | ICD-10-CM

## 2024-04-03 DIAGNOSIS — B977 Papillomavirus as the cause of diseases classified elsewhere: Secondary | ICD-10-CM | POA: Diagnosis not present

## 2024-04-03 DIAGNOSIS — Z779 Other contact with and (suspected) exposures hazardous to health: Secondary | ICD-10-CM | POA: Diagnosis not present

## 2024-04-03 DIAGNOSIS — N393 Stress incontinence (female) (male): Secondary | ICD-10-CM | POA: Diagnosis not present

## 2024-04-03 DIAGNOSIS — A609 Anogenital herpesviral infection, unspecified: Secondary | ICD-10-CM | POA: Diagnosis not present

## 2024-04-03 DIAGNOSIS — R87810 Cervical high risk human papillomavirus (HPV) DNA test positive: Secondary | ICD-10-CM | POA: Diagnosis not present

## 2024-04-03 DIAGNOSIS — N952 Postmenopausal atrophic vaginitis: Secondary | ICD-10-CM | POA: Diagnosis not present

## 2024-04-03 DIAGNOSIS — Z1231 Encounter for screening mammogram for malignant neoplasm of breast: Secondary | ICD-10-CM | POA: Diagnosis not present

## 2024-04-07 ENCOUNTER — Other Ambulatory Visit: Payer: Self-pay | Admitting: Obstetrics

## 2024-04-07 DIAGNOSIS — N3946 Mixed incontinence: Secondary | ICD-10-CM

## 2024-05-02 ENCOUNTER — Encounter: Payer: Self-pay | Admitting: Family Medicine

## 2024-05-02 ENCOUNTER — Ambulatory Visit (INDEPENDENT_AMBULATORY_CARE_PROVIDER_SITE_OTHER): Admitting: Family Medicine

## 2024-05-02 VITALS — BP 122/80 | HR 80 | Temp 97.9°F | Resp 16 | Ht 64.5 in | Wt 156.4 lb

## 2024-05-02 DIAGNOSIS — F319 Bipolar disorder, unspecified: Secondary | ICD-10-CM

## 2024-05-02 DIAGNOSIS — K118 Other diseases of salivary glands: Secondary | ICD-10-CM | POA: Diagnosis not present

## 2024-05-02 DIAGNOSIS — D509 Iron deficiency anemia, unspecified: Secondary | ICD-10-CM | POA: Diagnosis not present

## 2024-05-02 NOTE — Patient Instructions (Addendum)
 A few things to remember from today's visit:  Submandibular gland mass - Plan: CBC with Differential/Platelet, CT Soft Tissue Neck W Contrast  Iron deficiency anemia, unspecified iron deficiency anemia type - Plan: CBC with Differential/Platelet  Will arrange neck imaging. Monitor for changes.  If you need refills for medications you take chronically, please call your pharmacy. Do not use My Chart to request refills or for acute issues that need immediate attention. If you send a my chart message, it may take a few days to be addressed, specially if I am not in the office.  Please be sure medication list is accurate. If a new problem present, please set up appointment sooner than planned today.

## 2024-05-02 NOTE — Progress Notes (Unsigned)
 ACUTE VISIT Chief Complaint  Patient presents with   lump on neck    Noticed on Wednesday, having some pain when swallowing    HPI: Summer Craig is a 68 y.o. female  with PMHx significant for hyperlipidemia, diverticulosis, bipolar disorder, and uterine cancer s/p hysterectomy, who is here today complaining of right submandibular and neck swelling 2 days ago.   She was eating peanut M&Ms at onset and noticed a weird sensation in affected area while swallowing.  Her daughter was present at the time and mentioned her neck appeared swollen.  Later that night she experienced similar sensation when eating a spicy meal.  Neck swelling has since decreased in size but noticeable right submandibular mass.  No hx of chewing or tobacco use.  Denies any pain/tenderness to touch, fever, chills, night sweats, dental issues, or lesions in her mouth, scalp, or face.   Mild anemia noted on prior labs. Has not noted abdominal pain, blood in the stool, melena, or gross hematuria.  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  Review of Systems  Constitutional:  Negative for activity change and appetite change.  HENT:  Negative for nosebleeds, postnasal drip and trouble swallowing.   Respiratory:  Negative for cough, shortness of breath and wheezing.   Gastrointestinal:  Negative for abdominal pain, nausea and vomiting.  Genitourinary:  Negative for decreased urine volume and dysuria.  Skin:  Negative for rash.  Neurological:  Negative for syncope and weakness.  See other pertinent positives and negatives in HPI.  Current Outpatient Medications on File Prior to Visit  Medication  Sig Dispense Refill   Biotin 5000 MCG TABS Take by mouth.     estradiol  (ESTRACE ) 0.1 MG/GM vaginal cream Place 1 g vaginally 3 (three) times a week. Place 0.5g nightly for two weeks then twice a week after 30 g 3   fluvoxaMINE  (LUVOX ) 50 MG tablet Take 1 tablet (50 mg total) by mouth 2 (two) times daily.  1   lamoTRIgine  (LAMICTAL ) 200 MG tablet Take 200 mg by mouth daily.  1   metFORMIN  (GLUCOPHAGE -XR) 500 MG 24 hr tablet Take 1 tablet (500 mg total) by mouth 2 (two) times daily. 180 tablet 2   Multiple Vitamin (MULTIVITAMIN WITH MINERALS) TABS tablet Take 1 tablet by mouth daily.     OLANZapine  (ZYPREXA ) 2.5 MG tablet Take 2.5 mg by mouth at bedtime.  0   trospium  (SANCTURA ) 20 MG tablet TAKE 1 TABLET(20 MG) BY MOUTH TWICE DAILY 60 tablet 0   Wheat Dextrin (BENEFIBER) CHEW Chew by mouth.     No current facility-administered medications on file prior to visit.    Past Medical History:  Diagnosis Date   Anxiety    Arthritis    both knees   Bipolar disorder (HCC)    Cataract    Colon polyp    Depression    Diverticulitis 2021   DVT (deep venous thrombosis) (HCC)    bunion surgery- post surgery- per patient. Was on blood thinners for short period. More than 10 years ago per pt on 01/10/22.   Dysplasia of cervix, low grade (CIN 1) 12/14/2011   LEEP procedures x 2  Genital warts    GERD (gastroesophageal reflux disease)    nexium as needed   Herpes genitalia    History of hiatal hernia    Pre-diabetes    Patient takes Metformin  and Ozempic  to help with blood sugar and weight loss.   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)  Tobacco Use   Smoking status: Never   Smokeless tobacco: Never  Vaping Use   Vaping status: Never Used  Substance and Sexual Activity   Alcohol use: Yes    Alcohol/week: 1.0 standard drink of alcohol    Types: 1 Glasses of wine per week    Comment: socially - infrequent   Drug use: Never   Sexual activity: Not Currently    Partners: Male    Birth control/protection: Post-menopausal  Other Topics Concern   Not on file  Social History Narrative   Not on file   Social Drivers of Health   Financial Resource Strain: Low Risk  (01/07/2024)   Overall Financial Resource Strain (CARDIA)    Difficulty of Paying Living Expenses: Not hard at all  Food Insecurity: No Food Insecurity (01/07/2024)   Hunger Vital Sign    Worried About Running Out of Food in the Last Year: Never true    Ran Out of Food in the Last Year: Never true  Transportation Needs: No Transportation Needs (01/07/2024)   PRAPARE - Administrator, Civil Service (Medical): No    Lack of Transportation (Non-Medical): No  Physical Activity: Sufficiently Active (01/07/2024)   Exercise Vital Sign    Days of Exercise per Week: 7 days    Minutes of Exercise per Session: 30 min  Stress: No Stress Concern Present (01/07/2024)   [employer] of Occupational Health - Occupational Stress Questionnaire    Feeling of Stress : Only a little  Social Connections: Moderately Integrated (01/07/2024)   Social Connection and Isolation Panel    Frequency of Communication with Friends and Family: More than three times a week    Frequency of Social Gatherings with Friends and Family: Twice a week    Attends Religious Services: More than 4 times per year    Active Member of [employer] or Organizations: Yes    Attends Banker Meetings: More than 4 times per year    Marital Status: Divorced   Vitals:   05/02/24 1134  BP: 122/80  Pulse: 80  Resp: 16  Temp: 97.9 F (36.6 C)  SpO2: 98%   Body mass index is  26.43 kg/m.  Physical Exam Vitals and nursing note reviewed.  Constitutional:      General: She is not in acute distress.    Appearance: She is well-developed.  HENT:     Head: Normocephalic and atraumatic.     Mouth/Throat:     Mouth: Mucous membranes are moist.     Pharynx: Oropharynx is clear.   Eyes:     Conjunctiva/sclera: Conjunctivae normal.    Cardiovascular:     Rate and Rhythm: Normal rate and regular rhythm.  Heart sounds: No murmur heard. Pulmonary:     Effort: Pulmonary effort is normal. No respiratory distress.     Breath sounds: Normal breath sounds.  Abdominal:     Palpations: Abdomen is soft. There is no mass.     Tenderness: There is no abdominal tenderness.   Musculoskeletal:     Right lower leg: No edema.     Left lower leg: No edema.  Lymphadenopathy:     Head:     Right side of head: Submandibular adenopathy present.     Left side of head: No submandibular adenopathy.     Cervical: No cervical adenopathy.     Upper Body:     Right upper body: No supraclavicular adenopathy.     Left upper body: No supraclavicular adenopathy.     Comments: Enlarged right submandibular gland, about 4-5 cm, not tender, no skin chnages   Skin:    General: Skin is warm.     Findings: No erythema or rash.   Neurological:     General: No focal deficit present.     Mental Status: She is alert and oriented to person, place, and time.     Gait: Gait normal.   Psychiatric:        Mood and Affect: Mood and affect normal.   ASSESSMENT AND PLAN: Ms. Summer Craig was seen today for neck swelling.  *** Submandibular gland mass We discussed possible etiologies. It does not seem to be infectious. We discussed options, including monitoring for now, following with ENT, or ordering imaging, she would like the latter one. Soft tissue neck CT order placed. Monitor for new symptoms. Further recommendation will be given according to CBC results. She has an  appointment with her ENT in 05/2024, recommend addressing problem if still present.  -     CBC with Differential/Platelet; Future -     CT SOFT TISSUE NECK W CONTRAST; Future  Iron deficiency anemia, unspecified iron deficiency anemia type H/H10.7/31.8. Last colonoscopy in 11/2019. Further recommendation will be given according to lab results.  -     CBC with Differential/Platelet; Future  Bipolar affective disorder, remission status unspecified (HCC) Follows with psychiatrist every 6 months.  Return if symptoms worsen or fail to improve, for keep next appointment.  I, Bernita Bristle, acting as a scribe for Woodfin Kiss [place], MD., have documented all relevant documentation on the behalf of Kaitelyn Jamison [place], MD, as directed by   while in the presence of Geri Hepler [place], MD.  I, Eunice Oldaker [place], MD, have reviewed all documentation for this visit. The documentation on 05/02/24 for the exam, diagnosis, procedures, and orders are all accurate and complete.  Ellionna Buckbee G. [place], MD  [facility]. [place] [facility].

## 2024-05-03 LAB — CBC WITH DIFFERENTIAL/PLATELET
Basophils Absolute: 0 10*3/uL (ref 0.0–0.2)
Basos: 1 %
EOS (ABSOLUTE): 0.1 10*3/uL (ref 0.0–0.4)
Eos: 2 %
Hematocrit: 34.8 % (ref 34.0–46.6)
Hemoglobin: 11.5 g/dL (ref 11.1–15.9)
Immature Grans (Abs): 0 10*3/uL (ref 0.0–0.1)
Immature Granulocytes: 0 %
Lymphocytes Absolute: 1.8 10*3/uL (ref 0.7–3.1)
Lymphs: 35 %
MCH: 31.6 pg (ref 26.6–33.0)
MCHC: 33 g/dL (ref 31.5–35.7)
MCV: 96 fL (ref 79–97)
Monocytes Absolute: 0.5 10*3/uL (ref 0.1–0.9)
Monocytes: 9 %
Neutrophils Absolute: 2.7 10*3/uL (ref 1.4–7.0)
Neutrophils: 53 %
Platelets: 253 10*3/uL (ref 150–450)
RBC: 3.64 x10E6/uL — ABNORMAL LOW (ref 3.77–5.28)
RDW: 12.1 % (ref 11.7–15.4)
WBC: 5.1 10*3/uL (ref 3.4–10.8)

## 2024-05-04 ENCOUNTER — Ambulatory Visit: Payer: Self-pay | Admitting: Family Medicine

## 2024-05-06 ENCOUNTER — Other Ambulatory Visit: Payer: Self-pay

## 2024-05-06 DIAGNOSIS — N3946 Mixed incontinence: Secondary | ICD-10-CM

## 2024-05-06 MED ORDER — TROSPIUM CHLORIDE 20 MG PO TABS: 20.0000 mg | ORAL_TABLET | Freq: Two times a day (BID) | ORAL | 0 refills | Status: DC

## 2024-05-08 ENCOUNTER — Telehealth: Payer: Self-pay | Admitting: *Deleted

## 2024-05-08 ENCOUNTER — Telehealth (HOSPITAL_BASED_OUTPATIENT_CLINIC_OR_DEPARTMENT_OTHER): Payer: Self-pay

## 2024-05-08 NOTE — Telephone Encounter (Signed)
 Pt called in asking why her INS didn't authorize the CT scan; asked pt if they called their INS to see why and pt stated no. Advise pt to call INS company to see why it was denied and if it's something we need to do on our end then INS was let us  know. Pt understood.   FYI

## 2024-05-08 NOTE — Telephone Encounter (Signed)
 [employer] said pt is on the other line and per pt ct scan has been approve and told e2c2 agent to tell pt to can the imaging center to get back on the book and to call and ask for norma if any concerns

## 2024-05-08 NOTE — Telephone Encounter (Signed)
 Source  Summer Craig (Patient)   Subject  Summer Craig (Patient)   Topic  General - Other    Communication  Reason for CRM: Patient was scheduled for a CT scan tomorrow but was canceled due to authorization issues. Patient would like to know when the office will be obtaining an auth so she could have her exam .

## 2024-05-08 NOTE — Telephone Encounter (Signed)
 Communication  Reason for CRM: Patient was scheduled for a CT scan tomorrow but was canceled due to authorization issues. Patient would like to know when the office will be obtaining an auth so she could have her exam .

## 2024-05-08 NOTE — Telephone Encounter (Signed)
 Pt was unable to call to resch CT scan and pt has the approval code  and would like referral coordinator to get appt sch for tomorrow

## 2024-05-09 ENCOUNTER — Ambulatory Visit (HOSPITAL_BASED_OUTPATIENT_CLINIC_OR_DEPARTMENT_OTHER)
Admission: RE | Admit: 2024-05-09 | Discharge: 2024-05-09 | Disposition: A | Discharge status: Home / Self Care | Source: Ambulatory Visit | Attending: Family Medicine | Admitting: Family Medicine

## 2024-05-09 ENCOUNTER — Ambulatory Visit (HOSPITAL_BASED_OUTPATIENT_CLINIC_OR_DEPARTMENT_OTHER)

## 2024-05-09 ENCOUNTER — Encounter (HOSPITAL_BASED_OUTPATIENT_CLINIC_OR_DEPARTMENT_OTHER): Payer: Self-pay

## 2024-05-09 ENCOUNTER — Telehealth: Payer: Self-pay

## 2024-05-09 DIAGNOSIS — K118 Other diseases of salivary glands: Secondary | ICD-10-CM | POA: Diagnosis not present

## 2024-05-09 DIAGNOSIS — R590 Localized enlarged lymph nodes: Secondary | ICD-10-CM | POA: Diagnosis not present

## 2024-05-09 MED ORDER — IOHEXOL 300 MG/ML  SOLN
75.0000 mL | Freq: Once | INTRAMUSCULAR | Status: AC | PRN
  Administered 2024-05-09: 75 mL via INTRAVENOUS

## 2024-05-09 NOTE — Telephone Encounter (Signed)
 See other phone note, sent to referral coordinator.

## 2024-05-09 NOTE — Telephone Encounter (Signed)
 Copied from CRM [id number]. Topic: General - Other >> May 09, 2024 10:09 AM [place] E wrote: Reason for CRM: Patient received a message form [employer] stating that her provider is not in-network with them, when she has never had an issue or been told this in the past. Patient would like a callback either before 10:30 or after 11.

## 2024-05-09 NOTE — Telephone Encounter (Signed)
 Copied from CRM [id number]. Topic: General - Other >> May 09, 2024  8:40 AM Summer Craig wrote: Reason for CRM: [facility] notes sent to her proving that she has cancer and if she needs to be sent to an out of stated cancer clinic  would like for a nurse to call back to confirm the cancer diagnosis   Fax number for the clinic notes :[phone] Reference Number [id number]  Call her back at [phone]

## 2024-05-09 NOTE — Telephone Encounter (Signed)
 Faxed OV note from 6/20 to [employer] per Pt's request.

## 2024-05-09 NOTE — Telephone Encounter (Signed)
 I called and spoke with [place]. Pt was not referred by our office so we don't have any information to provide them with. Did let them know she has a CT scan today, but we don't have the results of that. They will redirect the referral in state until more info is received.

## 2024-05-12 ENCOUNTER — Telehealth: Payer: Self-pay

## 2024-05-12 ENCOUNTER — Telehealth: Payer: Self-pay | Admitting: Family Medicine

## 2024-05-12 NOTE — Telephone Encounter (Signed)
 Can you review pt's CT that just resulted today? Thank you!

## 2024-05-12 NOTE — Telephone Encounter (Signed)
 They are saying Dr. [place] isn't in network

## 2024-05-12 NOTE — Telephone Encounter (Signed)
 I called and spoke with Summer Craig. Pt does not have any out of network benefits, so pt would be better off seeing oncology here, and then asking for a second opinion through them to get the out of network referral approved.

## 2024-05-12 NOTE — Telephone Encounter (Signed)
 See my chart encounter.

## 2024-05-12 NOTE — Telephone Encounter (Signed)
 Copied from CRM [id number]. Topic: General - Other >> May 08, 2024  4:52 PM Zebedee SAUNDERS wrote: Reason for CRM: Received call from [facility] per Al[place]so ph: [phone], regarding referrals for pt one going to [place] and another one going to [place] .

## 2024-05-15 ENCOUNTER — Other Ambulatory Visit: Payer: Self-pay | Admitting: Obstetrics

## 2024-05-15 DIAGNOSIS — N3946 Mixed incontinence: Secondary | ICD-10-CM

## 2024-05-19 ENCOUNTER — Encounter: Payer: Self-pay | Admitting: Family Medicine

## 2024-05-19 ENCOUNTER — Ambulatory Visit (INDEPENDENT_AMBULATORY_CARE_PROVIDER_SITE_OTHER): Admitting: Family Medicine

## 2024-05-19 VITALS — BP 120/80 | HR 82 | Resp 16 | Ht 64.5 in | Wt 158.0 lb

## 2024-05-19 DIAGNOSIS — M4692 Unspecified inflammatory spondylopathy, cervical region: Secondary | ICD-10-CM | POA: Diagnosis not present

## 2024-05-19 DIAGNOSIS — R590 Localized enlarged lymph nodes: Secondary | ICD-10-CM | POA: Diagnosis not present

## 2024-05-19 NOTE — Progress Notes (Unsigned)
 ACUTE VISIT Chief Complaint  Patient presents with   Results   HPI: Ms.Summer Craig is a 68 y.o. female with PMHx significant for hyperlipidemia, diverticulosis, bipolar disorder, and uterine cancer s/p hysterectomy, who is here today to follow-up for submandibular gland mass and to go through neck CT result.  Initially seen for this on 6/20 for neck mass, on examination right submandibular gland seemed enlarged. Subsequent neck CT showing mild shotty cervical lymphadenopathy, likely reactive. There is no lesion evident within the right submandibular gland.   CBC normal except for slightly low RBC, 3.64.   Today says that since being seen the right-sided submandibular mass has resolved. She is not having any fever,chills, night sweats,or abnormal wt loss.    Lab Results  Component Value Date   WBC 5.1 05/02/2024   HGB 11.5 05/02/2024   HCT 34.8 05/02/2024   MCV 96 05/02/2024   PLT 253 05/02/2024    Mentions that she is still having issues with TMJ, she has seen ENT and had MRI done. She will be going for evaluation with a new ENT on Wednesday.   Review of Systems  Constitutional:  Negative for activity change, appetite change and chills.  HENT:  Negative for mouth sores and trouble swallowing.   Respiratory:  Negative for cough, shortness of breath and wheezing.   Cardiovascular:  Negative for chest pain.  Gastrointestinal:  Negative for abdominal pain, nausea and vomiting.  Musculoskeletal:  Negative for neck pain.  Neurological:  Negative for syncope, weakness and headaches.  Hematological:  Negative for adenopathy. Does not bruise/bleed easily.  All other systems reviewed and are negative. See other pertinent positives and negatives in HPI.  Current Outpatient Medications on File Prior to Visit  Medication Sig Dispense Refill   Biotin 5000 MCG TABS Take by mouth.     estradiol  (ESTRACE ) 0.1 MG/GM vaginal cream Place 1 g vaginally 3 (three) times a week. Place 0.5g  nightly for two weeks then twice a week after 30 g 3   fluvoxaMINE  (LUVOX ) 50 MG tablet Take 1 tablet (50 mg total) by mouth 2 (two) times daily.  1   lamoTRIgine  (LAMICTAL ) 200 MG tablet Take 200 mg by mouth daily.  1   metFORMIN  (GLUCOPHAGE -XR) 500 MG 24 hr tablet Take 1 tablet (500 mg total) by mouth 2 (two) times daily. 180 tablet 2   Multiple Vitamin (MULTIVITAMIN WITH MINERALS) TABS tablet Take 1 tablet by mouth daily.     OLANZapine  (ZYPREXA ) 2.5 MG tablet Take 2.5 mg by mouth at bedtime.  0   trospium  (SANCTURA ) 20 MG tablet Take 1 tablet (20 mg total) by mouth 2 (two) times daily. 180 tablet 0   Wheat Dextrin (BENEFIBER) CHEW Chew by mouth.     No current facility-administered medications on file prior to visit.    Past Medical History:  Diagnosis Date   Anxiety    Arthritis    both knees   Bipolar disorder (HCC)    Cataract    Colon polyp    Depression    Diverticulitis 2021   DVT (deep venous thrombosis) (HCC)    bunion surgery- post surgery- per patient. Was on blood thinners for short period. More than 10 years ago per pt on 01/10/22.   Dysplasia of cervix, low grade (CIN 1) 12/14/2011   LEEP procedures x 2   Genital warts    GERD (gastroesophageal reflux disease)    nexium as needed   Herpes genitalia    History  of hiatal hernia    Pre-diabetes    Patient takes Metformin  and Ozempic  to help with blood sugar and weight loss.   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)  Tobacco Use   Smoking status: Never   Smokeless tobacco: Never  Vaping Use   Vaping status: Never Used  Substance and Sexual  Activity   Alcohol use: Yes    Alcohol/week: 1.0 standard drink of alcohol    Types: 1 Glasses of wine per week    Comment: socially - infrequent   Drug use: Never   Sexual activity: Not Currently    Partners: Male    Birth control/protection: Post-menopausal  Other Topics Concern   Not on file  Social History Narrative   Not on file   Social Drivers of Health   Financial Resource Strain: Low Risk  (05/15/2024)   Overall Financial Resource Strain (CARDIA)    Difficulty of Paying Living Expenses: Not hard at all  Food Insecurity: No Food Insecurity (05/15/2024)   Hunger Vital Sign    Worried About Running Out of Food in the Last Year: Never true    Ran Out of Food in the Last Year: Never true  Transportation Needs: No Transportation Needs (05/15/2024)   PRAPARE - Administrator, Civil Service (Medical): No    Lack of Transportation (Non-Medical): No  Physical Activity: Sufficiently Active (05/15/2024)   Exercise Vital Sign    Days of Exercise per Week: 7 days    Minutes of Exercise per Session: 40 min  Stress: No Stress Concern Present (01/07/2024)   [employer] of Occupational Health - Occupational Stress Questionnaire    Feeling of Stress : Only a little  Social Connections: Moderately Integrated (05/15/2024)   Social Connection and Isolation Panel    Frequency of Communication with Friends and Family: More than three times a week    Frequency of Social Gatherings with Friends and Family: More than three times a week    Attends Religious Services: More than 4 times per year    Active Member of Clubs or Organizations: Yes    Attends Banker Meetings: More than 4 times per year    Marital Status: Divorced   Vitals:   05/19/24 1357  BP: 120/80  Pulse: 82  Resp: 16  SpO2: 99%   Body mass index is 26.7 kg/m.  Physical Exam Vitals and nursing note reviewed.  Constitutional:      General: She is not in acute distress.    Appearance: She is  well-developed.  HENT:     Head: Normocephalic and atraumatic.     Mouth/Throat:     Mouth: Mucous membranes are moist.     Pharynx: Oropharynx is clear.  Eyes:     Conjunctiva/sclera: Conjunctivae normal.  Cardiovascular:     Rate and Rhythm: Normal rate and regular rhythm.     Heart sounds: No murmur heard. Pulmonary:     Effort: Pulmonary effort is normal. No respiratory distress.     Breath sounds: Normal breath sounds.  Lymphadenopathy:  Head:     Right side of head: No submandibular adenopathy.     Left side of head: No submandibular adenopathy.     Cervical: No cervical adenopathy.     Upper Body:     Right upper body: No supraclavicular adenopathy.     Left upper body: No supraclavicular adenopathy.  Skin:    General: Skin is warm.     Findings: No erythema or rash.  Neurological:     General: No focal deficit present.     Mental Status: She is alert and oriented to person, place, and time.     Gait: Gait normal.  Psychiatric:        Mood and Affect: Mood and affect normal.    ASSESSMENT AND PLAN: Ms.Summer Craig was seen here today to follow-up for Submandibular Gland Mass/neck CT review.  Reactive cervical lymphadenopathy  Right-sided mandibular gland back to normal. Recent neck CT showed a reactive cervical lymphadenopathy, no suspicious lesions.We discussed diagnosis and prognosis. Monitor for new symptoms. Has an appt with ENT in 2 days. Follow-up as needed.  I spent a total of 23 minutes in both face to face and non face to face activities for this visit on the date of this encounter. During this time history was obtained and documented, examination was performed, prior labs/imaging reviewed, and assessment/plan discussed.  Return if symptoms worsen or fail to improve, for keep next appointment.  I,Emily Lagle,acting as a Neurosurgeon for Jayston Trevino [place], MD.,have documented all relevant documentation on the behalf of Airiel Oblinger [place], MD,as directed by  Jomari Bartnik  [place], MD while in the presence of Alma Muegge [place], MD.  I, Alyshia Kernan [place], MD, have reviewed all documentation for this visit. The documentation on 05/19/24 for the exam, diagnosis, procedures, and orders are all accurate and complete.  Carroll Lingelbach G. [place], MD  [facility]. [place] office.

## 2024-05-19 NOTE — Patient Instructions (Addendum)
 A few things to remember from today's visit:  Reactive cervical lymphadenopathy No further work up needed at this time.  Do not use My Chart to request refills or for acute issues that need immediate attention. If you send a my chart message, it may take a few days to be addressed, specially if I am not in the office.  Please be sure medication list is accurate. If a new problem present, please set up appointment sooner than planned today.

## 2024-05-23 ENCOUNTER — Ambulatory Visit (INDEPENDENT_AMBULATORY_CARE_PROVIDER_SITE_OTHER): Payer: Medicare Other | Admitting: Otolaryngology

## 2024-05-23 ENCOUNTER — Ambulatory Visit (INDEPENDENT_AMBULATORY_CARE_PROVIDER_SITE_OTHER): Admitting: Audiology

## 2024-05-27 ENCOUNTER — Ambulatory Visit (INDEPENDENT_AMBULATORY_CARE_PROVIDER_SITE_OTHER): Admitting: Otolaryngology

## 2024-05-27 ENCOUNTER — Ambulatory Visit (INDEPENDENT_AMBULATORY_CARE_PROVIDER_SITE_OTHER): Admitting: Audiology

## 2024-05-27 NOTE — Progress Notes (Deleted)
  [address], [address] [place], [place] [address] [phone]  Audiological Evaluation    Name: STEPHNIE PARLIER     DOB:   [birth year]      MRN:   [id number]                                                                                     Service Date: 05/27/2024     Accompanied by: ***   Patient comes today after Dr. Karis, ENT sent a referral for a hearing evaluation due to concerns with {audiology symptoms:31224}.   Symptoms Yes Details  Hearing loss  []    Tinnitus  []    Ear pain/ infections/pressure  []    Balance problems  []    Noise exposure history  []    Previous ear surgeries  []    Family history of hearing loss  []    Amplification  []    Other  []      Otoscopy: Right ear: {otoscopy:31227} Left ear:  {otoscopy:31227}  Tympanometry: Right ear: {tympanometry results:31367}. Left ear: {tympanometry results:31367}.  Pure tone Audiometry: Right ear- *** {hearing loss types:31372::sensorineural hearing loss} from *** Hz - *** Hz. Left ear-  *** {hearing loss types:31372::sensorineural hearing loss} from *** Hz - *** Hz.  Speech Audiometry: Right ear- Speech Reception Threshold (SRT) was obtained at *** dBHL. Left ear-Speech Reception Threshold (SRT) was obtained at *** dBHL.   Word Recognition Score Tested using NU-6 (recorded) Right ear: ***% was obtained at a presentation level of *** dBHL with contralateral masking which is deemed as  {word recognition score:31373}. Left ear: ***% was obtained at a presentation level of *** dBHL with contralateral masking which is deemed as  {word recognition score:31373}.   The hearing test results were completed under headphones and results are deemed to be of {test reliability:31390::good reliability}. Test technique:  conventional    Impression: {Word recognition Score interpretation:31432::There is not a significant difference in pure-tone thresholds between ears.,There is not a significant difference in  the word recognition score in between ears. }   Recommendations: {Audiology Recommendations:31370::Follow up with ENT as scheduled for today.}   Dugan Vanhoesen MARIE LEROUX-MARTINEZ, AUD

## 2024-06-02 NOTE — Progress Notes (Unsigned)
 [facility] Return Visit  SUBJECTIVE  History of Present Illness: [facility] is a 68 y.o. female seen in follow-up for mixed urinary incontinence, fecal incontinence, and vaginal atrophy. Plan at last visit was vaginal estrogen, Trospium , continue Kegel exercises.   Reports 25% improvement down from 50% improvement in symptoms since stopping vaginal estrogen 1.5 months ago per discussion with Dr. Kandyce. Mostly from slight increase in odor of urine, denies visible urine staining on pads and unable to quantify leakage Symptoms returned after approximately 2 weeks of discontinuation  Desires to continue Trospium  due to lack of fecal incontinence since starting medication.  Failed pelvic floor PT and previously considered repeat trial of mirabegron    Reduced pad thickness from 2 to 1, with occasional  #2 pad usage.  Previously reduced fluid intake from 160oz to 60oz/day UUI 0x/wk down from 6-10x/wk due to decreased sensation with larger volume leakage, attributed relief to 6 wks of Kegel exercises and vaginal estrogen Leaks 5x/wk with coughing/sneezing when her bladder is full Pad use: 1 liners/ mini-pads per day up to 2 liner/day (for freshness) mostly for use at night  Gemtesa  cost prohibitive ($400/month), unable to tell a difference with 4 weeks of use.  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   Past Surgical History: She  has a past surgical history that includes bunion removal (Left);  Tonsillectomy and adenoidectomy; Bunionectomy (Right); Knee arthroscopy (Left, 2022); Colonoscopy w/ polypectomy; Colonoscopy (12/02/2019); LEFT MEDIAL DISPLACEMENT CALCANEAL OSTEOTOMY, POSTERIOR TIBIAL TENDON DEBRIDEMENT AND TENODESIS (11/09/2020); FLEXOR DIGITORUM LONGUS TRANSFER, SPRING LIGAMENT RECONSTRUCTION, POSSIBLE TENDO ACHILLES LENGTHENING (Left  (11/09/2020); Arthrodesis foot with weil osteotomy (Left, 11/09/2020); Robotic assisted total hysterectomy with bilateral salpingo oophorectomy (Bilateral, 01/16/2022); and Abdominal hysterectomy.   Medications: She has a current medication list which includes the following prescription(s): biotin, fluvoxamine , lamotrigine , meloxicam, metformin , methocarbamol , multivitamin with minerals, olanzapine , phenazopyridine , trospium , benefiber, and estradiol .   Allergies: Patient has no known allergies.   Social History: Patient  reports that she has never smoked. She has never used smokeless tobacco. She reports current alcohol use of about 1.0 standard drink of alcohol per week. She reports that she does not use drugs.     OBJECTIVE     Physical Exam: Vitals:   06/04/24 1103  BP: 133/76  Pulse: 69    Gen: No apparent distress, A&O x 3.  Detailed Urogynecologic Evaluation:  Deferred.       ASSESSMENT AND PLAN    [name] is a 68 y.o. with:  1. Urinary incontinence, mixed   2. Vaginal atrophy   3. Incontinence of feces, unspecified fecal incontinence type      Urinary incontinence, mixed Assessment & Plan: - 12/2023 POCT + heme, negative UA microscopy and culture - urgency > stress, onset after left ankle injury and surgery. Resolved after fluid management, Kegels, and vaginal estrogen. Stopped vaginal estrogen due to concerns with history of endometrial CA. Reviewed with Dr. Viktoria and encouraged pt to resume 1g 2x/week - failed pelvic floor PT and mirabegron in the past - prefers to continue trospium  due to relief of FI.  Unclear  symptomatic relief from UI. Rx pyridium  for pyridium  pad test to objectively measure UI symptoms. Encouraged to perform now and after 4-6 weeks of vaginal estrogen  - We discussed the symptoms of overactive bladder (OAB), which include urinary urgency, urinary frequency, nocturia, with or without urge incontinence.  While we do not know the exact etiology of OAB, several treatment options exist. We discussed management including behavioral therapy (decreasing bladder irritants, urge suppression strategies, timed voids, bladder retraining), physical therapy, medication; for refractory cases posterior tibial nerve stimulation, sacral neuromodulation, and intravesical botulinum toxin injection.  For anticholinergic medications, we discussed the potential side effects of anticholinergics including dry eyes, dry mouth, constipation, cognitive impairment and urinary retention. For Beta-3 agonist medication, we discussed the potential side effect of elevated blood pressure which is more likely to occur in individuals with uncontrolled hypertension. - gemtesa  is cost prohibitive - continue behavioral modification with fluid management - For treatment of stress urinary incontinence,  non-surgical options include expectant management, weight loss, physical therapy, as well as a pessary.  Surgical options include a midurethral sling, Burch urethropexy, and transurethral injection of a bulking agent. - continue Trospium  due to resolution of bowel leakage and possible improvement in urgency urinary leakage - Discussed possible repeat trial of mirabegron if refractory symptoms - pt desires to avoid 3rd line therapy for OAB or procedural interventions.  Orders: -     Phenazopyridine  HCl; Take 1 tablet (100 mg total) by mouth 3 (three) times daily as needed for pain.  Dispense: 10 tablet; Refill: 0  Vaginal atrophy Assessment & Plan: - For symptomatic vaginal atrophy options include lubrication with a  water-based lubricant, personal hygiene measures and barrier protection against wetness, and estrogen replacement in the form of vaginal cream, vaginal tablets, or a time-released vaginal ring.   - encouraged to resume vaginal estrogen to 1g 2x/week due to prior improvement, reviewed with Dr. Lewie team - We discussed the potential risks associated with hormone replacement including stroke, heart attack, and blood clots; and the fact that these risks are very low with vaginal estrogen use due to the very low systemic absorption rate of ~ 0.01% with a twice-week regimen.   Incontinence of feces, unspecified fecal incontinence type Assessment & Plan: - 5 episodes/year with liquid stool, 1 since last visit in around 2 months. Denies symptoms since Trospium , continue to monitor - reduced symptoms when she takes benefiber in the daytime compared to night time - denies IBS, reports spinach as dietary trigger - Treatment options include anti-diarrhea medication (loperamide/ Imodium OTC or prescription lomotil), fiber supplements, physical therapy, and possible sacral neuromodulation or surgery.   - continue Kegel exercises and diary to record episodes - encouraged trial of IBGuard if symptoms persist or worsens    Time spent: I spent 28 minutes dedicated to the care of this patient on the date of this encounter to include pre-visit review of records, face-to-face time with the patient discussing mixed urinary incontinence, vaginal atrophy, fecal incontinence, abnormal urinalysis, and post visit documentation and ordering medication/ testing.   Lianne ONEIDA Gillis, MD

## 2024-06-04 ENCOUNTER — Encounter: Payer: Self-pay | Admitting: Obstetrics

## 2024-06-04 ENCOUNTER — Ambulatory Visit: Admitting: Obstetrics

## 2024-06-04 VITALS — BP 133/76 | HR 69

## 2024-06-04 DIAGNOSIS — N3946 Mixed incontinence: Secondary | ICD-10-CM

## 2024-06-04 DIAGNOSIS — N952 Postmenopausal atrophic vaginitis: Secondary | ICD-10-CM | POA: Diagnosis not present

## 2024-06-04 DIAGNOSIS — R159 Full incontinence of feces: Secondary | ICD-10-CM | POA: Diagnosis not present

## 2024-06-04 MED ORDER — PHENAZOPYRIDINE HCL 100 MG PO TABS: 100.0000 mg | ORAL_TABLET | Freq: Three times a day (TID) | ORAL | 0 refills | Status: DC | PRN

## 2024-06-04 NOTE — Assessment & Plan Note (Signed)
-   5 episodes/year with liquid stool, 1 since last visit in around 2 months. Denies symptoms since Trospium , continue to monitor - reduced symptoms when she takes benefiber in the daytime compared to night time - denies IBS, reports spinach as dietary trigger - Treatment options include anti-diarrhea medication (loperamide/ Imodium OTC or prescription lomotil), fiber supplements, physical therapy, and possible sacral neuromodulation or surgery.   - continue Kegel exercises and diary to record episodes - encouraged trial of IBGuard if symptoms persist or worsens

## 2024-06-04 NOTE — Assessment & Plan Note (Addendum)
-   For symptomatic vaginal atrophy options include lubrication with a water-based lubricant, personal hygiene measures and barrier protection against wetness, and estrogen replacement in the form of vaginal cream, vaginal tablets, or a time-released vaginal ring.   - encouraged to resume vaginal estrogen to 1g 2x/week due to prior improvement, reviewed with Dr. Lewie team - We discussed the potential risks associated with hormone replacement including stroke, heart attack, and blood clots; and the fact that these risks are very low with vaginal estrogen use due to the very low systemic absorption rate of ~ 0.01% with a twice-week regimen.

## 2024-06-04 NOTE — Patient Instructions (Addendum)
 Start pyridium  take 1 tab by mouth up to 3 times a day.   If insurance does not cover medication, you can purchase Azo over the counter   Please reach out to Dr. Lewie office if you prefer to verify that you can use vaginal estrogen.  Continue Trospium  daily.

## 2024-06-04 NOTE — Assessment & Plan Note (Addendum)
-   12/2023 POCT + heme, negative UA microscopy and culture - urgency > stress, onset after left ankle injury and surgery. Resolved after fluid management, Kegels, and vaginal estrogen. Stopped vaginal estrogen due to concerns with history of endometrial CA. Reviewed with Dr. Viktoria and encouraged pt to resume 1g 2x/week - failed pelvic floor PT and mirabegron in the past - prefers to continue trospium  due to relief of FI. Unclear symptomatic relief from UI. Rx pyridium  for pyridium  pad test to objectively measure UI symptoms. Encouraged to perform now and after 4-6 weeks of vaginal estrogen  - We discussed the symptoms of overactive bladder (OAB), which include urinary urgency, urinary frequency, nocturia, with or without urge incontinence.  While we do not know the exact etiology of OAB, several treatment options exist. We discussed management including behavioral therapy (decreasing bladder irritants, urge suppression strategies, timed voids, bladder retraining), physical therapy, medication; for refractory cases posterior tibial nerve stimulation, sacral neuromodulation, and intravesical botulinum toxin injection.  For anticholinergic medications, we discussed the potential side effects of anticholinergics including dry eyes, dry mouth, constipation, cognitive impairment and urinary retention. For Beta-3 agonist medication, we discussed the potential side effect of elevated blood pressure which is more likely to occur in individuals with uncontrolled hypertension. - gemtesa  is cost prohibitive - continue behavioral modification with fluid management - For treatment of stress urinary incontinence,  non-surgical options include expectant management, weight loss, physical therapy, as well as a pessary.  Surgical options include a midurethral sling, Burch urethropexy, and transurethral injection of a bulking agent. - continue Trospium  due to resolution of bowel leakage and possible improvement in urgency  urinary leakage - Discussed possible repeat trial of mirabegron if refractory symptoms - pt desires to avoid 3rd line therapy for OAB or procedural interventions.

## 2024-06-05 DIAGNOSIS — K080 Exfoliation of teeth due to systemic causes: Secondary | ICD-10-CM | POA: Diagnosis not present

## 2024-07-08 DIAGNOSIS — G4733 Obstructive sleep apnea (adult) (pediatric): Secondary | ICD-10-CM | POA: Diagnosis not present

## 2024-07-08 DIAGNOSIS — G4710 Hypersomnia, unspecified: Secondary | ICD-10-CM | POA: Diagnosis not present

## 2024-08-07 DIAGNOSIS — G4733 Obstructive sleep apnea (adult) (pediatric): Secondary | ICD-10-CM | POA: Diagnosis not present

## 2024-08-08 DIAGNOSIS — D0371 Melanoma in situ of right lower limb, including hip: Secondary | ICD-10-CM | POA: Diagnosis not present

## 2024-08-08 DIAGNOSIS — R21 Rash and other nonspecific skin eruption: Secondary | ICD-10-CM | POA: Diagnosis not present

## 2024-08-28 ENCOUNTER — Other Ambulatory Visit: Payer: Self-pay | Admitting: Obstetrics

## 2024-08-28 DIAGNOSIS — F3175 Bipolar disorder, in partial remission, most recent episode depressed: Secondary | ICD-10-CM | POA: Diagnosis not present

## 2024-08-28 DIAGNOSIS — N3946 Mixed incontinence: Secondary | ICD-10-CM

## 2024-08-29 DIAGNOSIS — D0371 Melanoma in situ of right lower limb, including hip: Secondary | ICD-10-CM | POA: Diagnosis not present

## 2024-08-29 DIAGNOSIS — L988 Other specified disorders of the skin and subcutaneous tissue: Secondary | ICD-10-CM | POA: Diagnosis not present

## 2024-08-29 DIAGNOSIS — H524 Presbyopia: Secondary | ICD-10-CM | POA: Diagnosis not present

## 2024-09-13 ENCOUNTER — Other Ambulatory Visit: Payer: Self-pay

## 2024-09-13 ENCOUNTER — Emergency Department (HOSPITAL_COMMUNITY)

## 2024-09-13 ENCOUNTER — Encounter (HOSPITAL_COMMUNITY): Payer: Self-pay | Admitting: Emergency Medicine

## 2024-09-13 ENCOUNTER — Emergency Department (HOSPITAL_COMMUNITY)
Admission: EM | Admit: 2024-09-13 | Discharge: 2024-09-13 | Disposition: A | Discharge status: Home / Self Care | Attending: Emergency Medicine | Admitting: Emergency Medicine

## 2024-09-13 DIAGNOSIS — R000 Tachycardia, unspecified: Secondary | ICD-10-CM | POA: Insufficient documentation

## 2024-09-13 DIAGNOSIS — R1111 Vomiting without nausea: Secondary | ICD-10-CM | POA: Diagnosis not present

## 2024-09-13 DIAGNOSIS — N179 Acute kidney failure, unspecified: Secondary | ICD-10-CM | POA: Diagnosis not present

## 2024-09-13 DIAGNOSIS — R109 Unspecified abdominal pain: Secondary | ICD-10-CM | POA: Diagnosis not present

## 2024-09-13 DIAGNOSIS — R112 Nausea with vomiting, unspecified: Secondary | ICD-10-CM | POA: Diagnosis not present

## 2024-09-13 DIAGNOSIS — K5730 Diverticulosis of large intestine without perforation or abscess without bleeding: Secondary | ICD-10-CM | POA: Diagnosis not present

## 2024-09-13 DIAGNOSIS — I700 Atherosclerosis of aorta: Secondary | ICD-10-CM | POA: Diagnosis not present

## 2024-09-13 DIAGNOSIS — K529 Noninfective gastroenteritis and colitis, unspecified: Secondary | ICD-10-CM | POA: Diagnosis not present

## 2024-09-13 DIAGNOSIS — R110 Nausea: Secondary | ICD-10-CM | POA: Diagnosis not present

## 2024-09-13 DIAGNOSIS — K449 Diaphragmatic hernia without obstruction or gangrene: Secondary | ICD-10-CM | POA: Diagnosis not present

## 2024-09-13 DIAGNOSIS — R197 Diarrhea, unspecified: Secondary | ICD-10-CM | POA: Diagnosis not present

## 2024-09-13 LAB — URINALYSIS, ROUTINE W REFLEX MICROSCOPIC
Bilirubin Urine: NEGATIVE
Glucose, UA: NEGATIVE mg/dL
Ketones, ur: 5 mg/dL — AB
Nitrite: NEGATIVE
Protein, ur: 30 mg/dL — AB
Specific Gravity, Urine: >1.046 — ABNORMAL HIGH (ref 1.005–1.030)
pH: 5 (ref 5.0–8.0)

## 2024-09-13 LAB — COMPREHENSIVE METABOLIC PANEL WITH GFR
ALT: 21 U/L (ref 0–44)
AST: 27 U/L (ref 15–41)
Albumin: 5.2 g/dL — ABNORMAL HIGH (ref 3.5–5.0)
Alkaline Phosphatase: 95 U/L (ref 38–126)
Anion gap: 19 — ABNORMAL HIGH (ref 5–15)
BUN: 19 mg/dL (ref 8–23)
CO2: 17 mmol/L — ABNORMAL LOW (ref 22–32)
Calcium: 10.9 mg/dL — ABNORMAL HIGH (ref 8.9–10.3)
Chloride: 102 mmol/L (ref 98–111)
Creatinine, Ser: 1.38 mg/dL — ABNORMAL HIGH (ref 0.44–1.00)
GFR, Estimated: 42 mL/min — ABNORMAL LOW (ref >60)
Glucose, Bld: 149 mg/dL — ABNORMAL HIGH (ref 70–99)
Potassium: 4.1 mmol/L (ref 3.5–5.1)
Sodium: 137 mmol/L (ref 135–145)
Total Bilirubin: 0.9 mg/dL (ref 0.0–1.2)
Total Protein: 8.9 g/dL — ABNORMAL HIGH (ref 6.5–8.1)

## 2024-09-13 LAB — CBC
HCT: 46.2 % — ABNORMAL HIGH (ref 36.0–46.0)
Hemoglobin: 14.9 g/dL (ref 12.0–15.0)
MCH: 30.4 pg (ref 26.0–34.0)
MCHC: 32.3 g/dL (ref 30.0–36.0)
MCV: 94.3 fL (ref 80.0–100.0)
Platelets: 370 K/uL (ref 150–400)
RBC: 4.9 MIL/uL (ref 3.87–5.11)
RDW: 12.2 % (ref 11.5–15.5)
WBC: 18.2 K/uL — ABNORMAL HIGH (ref 4.0–10.5)
nRBC: 0 % (ref 0.0–0.2)

## 2024-09-13 LAB — C DIFFICILE QUICK SCREEN W PCR REFLEX
C Diff antigen: NEGATIVE
C Diff interpretation: NOT DETECTED
C Diff toxin: NEGATIVE

## 2024-09-13 LAB — LIPASE, BLOOD: Lipase: 61 U/L — ABNORMAL HIGH (ref 11–51)

## 2024-09-13 LAB — ETHANOL: Alcohol, Ethyl (B): <15 mg/dL (ref <15)

## 2024-09-13 MED ORDER — LACTATED RINGERS IV BOLUS
1000.0000 mL | Freq: Once | INTRAVENOUS | Status: AC
  Administered 2024-09-13: 1000 mL via INTRAVENOUS

## 2024-09-13 MED ORDER — IOHEXOL 300 MG/ML  SOLN
100.0000 mL | Freq: Once | INTRAMUSCULAR | Status: AC | PRN
Start: 2024-09-13 — End: 2024-09-13
  Administered 2024-09-13: 80 mL via INTRAVENOUS

## 2024-09-13 MED ORDER — PANTOPRAZOLE SODIUM 40 MG IV SOLR
40.0000 mg | Freq: Once | INTRAVENOUS | Status: AC
  Administered 2024-09-13: 40 mg via INTRAVENOUS
  Filled 2024-09-13: qty 10

## 2024-09-13 MED ORDER — ONDANSETRON 4 MG PO TBDP: 4.0000 mg | ORAL_TABLET | Freq: Three times a day (TID) | ORAL | 0 refills | Status: DC | PRN

## 2024-09-13 MED ORDER — ONDANSETRON HCL 4 MG/2ML IJ SOLN
4.0000 mg | Freq: Once | INTRAMUSCULAR | Status: AC
  Administered 2024-09-13: 4 mg via INTRAVENOUS
  Filled 2024-09-13: qty 2

## 2024-09-13 MED ORDER — LACTATED RINGERS IV BOLUS: 1000.0000 mL | Freq: Once | INTRAVENOUS | Status: DC

## 2024-09-13 NOTE — ED Notes (Signed)
Successful PO challenge

## 2024-09-13 NOTE — ED Provider Notes (Signed)
 [place] EMERGENCY DEPARTMENT AT [facility] Provider Note   CSN: [id number] Arrival date & time: 09/13/24  1612     Patient presents with: Abdominal Pain and Emesis   Summer Craig is a 68 y.o. female.   68 year old female presents to the emergency department with nausea, vomiting, and diarrhea.  Patient reported that today she started experiencing epigastric abdominal discomfort.  Has reflux and says it feels similar.  Also says that she started having dry heaving and has had 6-7 loose stools that are nonbloody and not melanotic.  Says that the abdominal pain is quite mild but she is starting to have chills and wanted to come in for evaluation.  Did drink some alcohol last night swallowing but says that it was only 1 drink.  No marijuana use.       Prior to Admission medications   Medication Sig Start Date End Date Taking? Authorizing Provider  ondansetron  (ZOFRAN -ODT) 4 MG disintegrating tablet Take 1 tablet (4 mg total) by mouth every 8 (eight) hours as needed for nausea or vomiting. 09/13/24  Yes Yolande Lamar BROCKS, MD  Biotin 5000 MCG TABS Take by mouth.    [provider]  estradiol  (ESTRACE ) 0.1 MG/GM vaginal cream Place 1 g vaginally 3 (three) times a week. Place 0.5g nightly for two weeks then twice a week after Patient not taking: Reported on 06/04/2024 03/03/24   Guadlupe Lianne DASEN, MD  fluvoxaMINE  (LUVOX ) 50 MG tablet Take 1 tablet (50 mg total) by mouth 2 (two) times daily. 12/12/19   Koberlein, Junell C, MD  lamoTRIgine  (LAMICTAL ) 200 MG tablet Take 200 mg by mouth daily. 05/01/18   [provider]  meloxicam (MOBIC) 15 MG tablet Take 15 mg by mouth daily. 05/19/24   [provider]  metFORMIN  (GLUCOPHAGE -XR) 500 MG 24 hr tablet Take 1 tablet (500 mg total) by mouth 2 (two) times daily. 01/11/24   Ozell Heron HERO, MD  methocarbamol  (ROBAXIN ) 500 MG tablet Take 500-1,000 mg by mouth at bedtime. 05/20/24   [provider]  Multiple  Vitamin (MULTIVITAMIN WITH MINERALS) TABS tablet Take 1 tablet by mouth daily.    [provider]  OLANZapine  (ZYPREXA ) 2.5 MG tablet Take 2.5 mg by mouth at bedtime. 07/13/18   [provider]  phenazopyridine  (PYRIDIUM ) 100 MG tablet Take 1 tablet (100 mg total) by mouth 3 (three) times daily as needed for pain. 06/04/24   Guadlupe Lianne DASEN, MD  trospium  (SANCTURA ) 20 MG tablet TAKE 1 TABLET(20 MG) BY MOUTH TWICE DAILY 08/29/24   Guadlupe Lianne DASEN, MD  Wheat Dextrin (BENEFIBER) CHEW Chew by mouth.    [provider]    Allergies: Patient has no known allergies.    Review of Systems  Updated Vital Signs BP 105/68 (BP Location: Right Arm)   Pulse (!) 101   Temp (!) 97.4 F (36.3 C) (Oral)   Resp 18   SpO2 98%   Physical Exam Vitals and nursing note reviewed.  Constitutional:      General: She is not in acute distress.    Appearance: She is well-developed.  HENT:     Head: Normocephalic and atraumatic.     Right Ear: External ear normal.     Left Ear: External ear normal.     Nose: Nose normal.  Eyes:     Extraocular Movements: Extraocular movements intact.     Conjunctiva/sclera: Conjunctivae normal.     Pupils: Pupils are equal, round, and reactive to light.  Cardiovascular:     Rate and Rhythm: Regular rhythm. Tachycardia present.     Heart sounds: No murmur heard. Pulmonary:     Effort: Pulmonary effort is normal. No respiratory distress.     Breath sounds: Normal breath sounds.  Abdominal:     General: Abdomen is flat. There is no distension.     Palpations: Abdomen is soft. There is no mass.     Tenderness: There is abdominal tenderness (Epigastric). There is no guarding.  Musculoskeletal:     Cervical back: Normal range of motion and neck supple.  Skin:    General: Skin is warm and dry.  Neurological:     Mental Status: She is alert and oriented to person, place, and time. Mental status is at baseline.  Psychiatric:        Mood and Affect: Mood  normal.     (all labs ordered are listed, but only abnormal results are displayed) Labs Reviewed  LIPASE, BLOOD - Abnormal; Notable for the following components:      Result Value   Lipase 61 (*)    All other components within normal limits  COMPREHENSIVE METABOLIC PANEL WITH GFR - Abnormal; Notable for the following components:   CO2 17 (*)    Glucose, Bld 149 (*)    Creatinine, Ser 1.38 (*)    Calcium 10.9 (*)    Total Protein 8.9 (*)    Albumin 5.2 (*)    GFR, Estimated 42 (*)    Anion gap 19 (*)    All other components within normal limits  CBC - Abnormal; Notable for the following components:   WBC 18.2 (*)    HCT 46.2 (*)    All other components within normal limits  URINALYSIS, ROUTINE W REFLEX MICROSCOPIC - Abnormal; Notable for the following components:   APPearance CLOUDY (*)    Specific Gravity, Urine >1.046 (*)    Hgb urine dipstick SMALL (*)    Ketones, ur 5 (*)    Protein, ur 30 (*)    Leukocytes,Ua TRACE (*)    Bacteria, UA RARE (*)    All other components within normal limits  C DIFFICILE QUICK SCREEN W PCR REFLEX    GASTROINTESTINAL PANEL BY PCR, STOOL (REPLACES STOOL CULTURE)  ETHANOL    EKG: None  Radiology: CT ABDOMEN PELVIS W CONTRAST Result Date: 09/13/2024 CLINICAL DATA:  Abdominal pain, nausea/vomiting/diarrhea EXAM: CT ABDOMEN AND PELVIS WITH CONTRAST TECHNIQUE: Multidetector CT imaging of the abdomen and pelvis was performed using the standard protocol following bolus administration of intravenous contrast. RADIATION DOSE REDUCTION: This exam was performed according to the departmental dose-optimization program which includes automated exposure control, adjustment of the mA and/or kV according to patient size and/or use of iterative reconstruction technique. CONTRAST:  80mL OMNIPAQUE  IOHEXOL  300 MG/ML  SOLN COMPARISON:  02/07/2022 FINDINGS: Lower chest: No acute pleural or parenchymal lung disease. Small hiatal hernia. Hepatobiliary: No focal liver  abnormality is seen. No gallstones, gallbladder wall thickening, or biliary dilatation. Pancreas: Unremarkable. No pancreatic ductal dilatation or surrounding inflammatory changes. Spleen: Normal in size without focal abnormality. Adrenals/Urinary Tract: Adrenal glands are unremarkable. Kidneys are normal, without renal calculi, focal lesion, or hydronephrosis. Bladder is only minimally distended, limiting its evaluation. No gross bladder abnormality. Stomach/Bowel: No bowel obstruction or ileus. Normal appendix right lower quadrant. Scattered colonic diverticulosis without evidence of acute diverticulitis. No bowel wall thickening or inflammatory change. Vascular/Lymphatic: Aortic atherosclerosis. No enlarged abdominal or pelvic lymph nodes. Reproductive: Status post hysterectomy. No adnexal  masses. Other: No free fluid or free intraperitoneal gas. No abdominal wall hernia. Musculoskeletal: No acute or destructive bony abnormalities. Reconstructed images demonstrate no additional findings. IMPRESSION: 1. No acute intra-abdominal or intrapelvic process. Normal appendix. 2. Scattered colonic diverticulosis without diverticulitis. 3. Hiatal hernia. 4.  Aortic Atherosclerosis (ICD10-I70.0). Electronically Signed   By: Ozell Daring M.D.   On: 09/13/2024 18:25     Procedures   Medications Ordered in the ED  lactated ringers  bolus 1,000 mL (0 mLs Intravenous Stopped 09/13/24 1843)  ondansetron  (ZOFRAN ) injection 4 mg (4 mg Intravenous Given 09/13/24 1710)  pantoprazole  (PROTONIX ) injection 40 mg (40 mg Intravenous Given 09/13/24 1710)  iohexol  (OMNIPAQUE ) 300 MG/ML solution 100 mL (80 mLs Intravenous Contrast Given 09/13/24 1811)    Clinical Course as of 09/13/24 2034  Sat Sep 13, 2024  1720 Creatinine(!): 1.38 Baseline 0.7 [RP]    Clinical Course User Index [RP] Yolande Lamar BROCKS, MD                                 Medical Decision Making Amount and/or Complexity of Data Reviewed Labs: ordered.  Decision-making details documented in ED Course. Radiology: ordered.  Risk Prescription drug management.   Milanna D Lourenco is a C33-year-old female presents emergency department with nausea vomiting and diarrhea  Initial Ddx:  Gastroenteritis, AKI, dehydration, pancreatitis, peptic ulcer disease  MDM/Course:  Patient presents to the emergency department with nausea vomiting diarrhea.  Also has some mild epigastric abdominal pain.  No heavy alcohol use but did drink a single beverage last night.  On exam does have epigastric abdominal tenderness to palpation.  She was given pain and nausea medication and upon re-evaluation was feeling much improved.  Lab work shows that she has a mild AKI right now.  Patient was able to actually give a stool sample which was sent off. Ct abdomen pelvis without acute findings. She is now tolerating p.o.  Requesting to go home.  Will be called with results of her stool sample if she needs to be started on antibiotics.  Given a prescription for Zofran  and instructed to follow-up with her primary doctor in couple days  This patient presents to the ED for concern of complaints listed in HPI, this involves an extensive number of treatment options, and is a complaint that carries with it a high risk of complications and morbidity. Disposition including potential need for admission considered.   Dispo: DC Home. Return precautions discussed including, but not limited to, those listed in the AVS. Allowed pt time to ask questions which were answered fully prior to dc.  Records reviewed Outpatient Clinic Notes The following labs were independently interpreted: Chemistry and show AKI I independently reviewed the following imaging with scope of interpretation limited to determining acute life threatening conditions related to emergency care: CT Abdomen/Pelvis and agree with the radiologist interpretation with the following exceptions: none I have reviewed the patients home  medications and made adjustments as needed Social Determinants of health:  Geriatric  Portions of this note were generated with Scientist, clinical (histocompatibility and immunogenetics). Dictation errors may occur despite best attempts at proofreading.     Final diagnoses:  Gastroenteritis  AKI (acute kidney injury)    ED Discharge Orders          Ordered    ondansetron  (ZOFRAN -ODT) 4 MG disintegrating tablet  Every 8 hours PRN        09/13/24 1936  Yolande Lamar BROCKS, MD 09/13/24 2034

## 2024-09-13 NOTE — Discharge Instructions (Signed)
You were seen for your viral bug (gastroenteritis) in the emergency department.  At home, please take the Zofran for your nausea and vomiting. Please be sure to stay well-hydrated.  Follow-up with your primary doctor in 2-3 days regarding your visit.  Return immediately to the emergency department if you experience any of the following: fainting, abdominal pain, high fevers, or any other concerning symptoms.  Thank you for visiting our Emergency Department. It was a pleasure taking care of you today.

## 2024-09-13 NOTE — ED Triage Notes (Signed)
 BIB EMS from home with n/v/d for about 3 hours. Some intermittent abdominal pain. Started after she took her acid reflux medication.  Has not eaten since last night. No fever.  Endorses weakness.  Dizziness with standing.

## 2024-09-14 LAB — GASTROINTESTINAL PANEL BY PCR, STOOL (REPLACES STOOL CULTURE)

## 2024-09-17 ENCOUNTER — Encounter: Payer: Self-pay | Admitting: Family Medicine

## 2024-10-06 NOTE — Progress Notes (Deleted)
 [facility] Return Visit  SUBJECTIVE  History of Present Illness: Summer Craig is a 68 y.o. female seen in follow-up for mixed urinary incontinence, fecal incontinence, and vaginal atrophy. Plan at last visit was resume vaginal estrogen, continue Trospium , pyridium  PRN, continue Kegel exercises and trial of IBGuard.   ED evaluation 09/13/24 for epigastric pain attributed to gastroenteritis and AKI with Cr 1.38. UA + leuk/protein/ketones/heme. Discharged after Zofran , Protonix , and IVF.  ***Reports 25% improvement down from 50% improvement in symptoms since stopping vaginal estrogen 1.5 months ago per discussion with Dr. Kandyce. Mostly from slight increase in odor of urine, denies visible urine staining on pads and unable to quantify leakage Symptoms returned after approximately 2 weeks of discontinuation  Desires to continue Trospium  due to lack of fecal incontinence since starting medication.  Failed pelvic floor PT and previously considered repeat trial of mirabegron    Reduced pad thickness from 2 to 1, with occasional  #2 pad usage.  Previously reduced fluid intake from 160oz to 60oz/day UUI 0x/wk down from 6-10x/wk due to decreased sensation with larger volume leakage, attributed relief to 6 wks of Kegel exercises and vaginal estrogen Leaks 5x/wk with coughing/sneezing when her bladder is full Pad use: 1 liners/ mini-pads per day up to 2 liner/day (for freshness) mostly for use at night  Gemtesa  cost prohibitive ($400/month), unable to tell a difference with 4 weeks of use.  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   Past Surgical History: She  has a past surgical history that includes bunion removal (Left); Tonsillectomy and adenoidectomy; Bunionectomy (Right); Knee arthroscopy (Left, 2022); Colonoscopy w/ polypectomy; Colonoscopy (12/02/2019); LEFT MEDIAL DISPLACEMENT CALCANEAL OSTEOTOMY, POSTERIOR TIBIAL TENDON DEBRIDEMENT AND TENODESIS (11/09/2020); FLEXOR DIGITORUM LONGUS TRANSFER, SPRING LIGAMENT RECONSTRUCTION, POSSIBLE TENDO ACHILLES LENGTHENING (Left  (11/09/2020); Arthrodesis foot with weil osteotomy (Left, 11/09/2020); Robotic assisted total hysterectomy with bilateral salpingo oophorectomy (Bilateral, 01/16/2022); and Abdominal hysterectomy.   Medications: She has a current medication list which includes the following prescription(s): biotin, estradiol , fluvoxamine , lamotrigine , meloxicam, metformin , methocarbamol , multivitamin with minerals, olanzapine , ondansetron , phenazopyridine , trospium , and benefiber.   Allergies: Patient has no known allergies.   Social History: Patient  reports that she has never smoked. She has never used smokeless tobacco. She reports current alcohol use of about 1.0 standard drink of alcohol per week. She reports that she does not use drugs.     OBJECTIVE     Physical Exam: There were no vitals filed for this visit.   Gen: No apparent distress, A&O x 3.  Detailed Urogynecologic Evaluation:  Deferred.       ASSESSMENT AND PLAN    [name] is a 68 y.o. with:  No diagnosis found.    There are no diagnoses linked to this encounter.  Time spent: I spent 28 minutes dedicated to the care of this patient on the date of this encounter to include pre-visit review of records, face-to-face time with the patient discussing mixed urinary incontinence, vaginal atrophy, fecal incontinence, abnormal urinalysis, and post visit documentation and  ordering medication/ testing.   Lianne ONEIDA Gillis, MD

## 2024-10-07 ENCOUNTER — Ambulatory Visit: Admitting: Obstetrics

## 2024-10-15 NOTE — Progress Notes (Unsigned)
 [facility] Return Visit  SUBJECTIVE  History of Present Illness: Summer Craig is a 68 y.o. female seen in follow-up for mixed urinary incontinence, fecal incontinence, and vaginal atrophy. Plan at last visit was resume vaginal estrogen, continue Trospium , pyridium  PRN, continue Kegel exercises and trial of IBGuard.   ED evaluation 09/13/24 for epigastric pain attributed to gastroenteritis and AKI with Cr 1.38. UA + leuk/protein/ketones/heme. Discharged after Zofran , Protonix , and IVF.  ***Reports 25% improvement down from 50% improvement in symptoms since stopping vaginal estrogen 1.5 months ago per discussion with Dr. Kandyce. Mostly from slight increase in odor of urine, denies visible urine staining on pads and unable to quantify leakage Symptoms returned after approximately 2 weeks of discontinuation  Desires to continue Trospium  due to lack of fecal incontinence since starting medication.  Failed pelvic floor PT and previously considered repeat trial of mirabegron    Reduced pad thickness from 2 to 1, with occasional  #2 pad usage.  Previously reduced fluid intake from 160oz to 60oz/day UUI 0x/wk down from 6-10x/wk due to decreased sensation with larger volume leakage, attributed relief to 6 wks of Kegel exercises and vaginal estrogen Leaks 5x/wk with coughing/sneezing when her bladder is full Pad use: 1 liners/ mini-pads per day up to 2 liner/day (for freshness) mostly for use at night  Gemtesa  cost prohibitive ($400/month), unable to tell a difference with 4 weeks of use.  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   Past Surgical History: She  has a past surgical history that includes bunion removal (Left); Tonsillectomy and adenoidectomy; Bunionectomy (Right); Knee arthroscopy (Left, 2022); Colonoscopy w/ polypectomy; Colonoscopy (12/02/2019); LEFT MEDIAL DISPLACEMENT CALCANEAL OSTEOTOMY, POSTERIOR TIBIAL TENDON DEBRIDEMENT AND TENODESIS (11/09/2020); FLEXOR DIGITORUM LONGUS TRANSFER, SPRING LIGAMENT RECONSTRUCTION, POSSIBLE TENDO ACHILLES LENGTHENING (Left  (11/09/2020); Arthrodesis foot with weil osteotomy (Left, 11/09/2020); Robotic assisted total hysterectomy with bilateral salpingo oophorectomy (Bilateral, 01/16/2022); and Abdominal hysterectomy.   Medications: She has a current medication list which includes the following prescription(s): biotin, estradiol , fluvoxamine , lamotrigine , meloxicam, metformin , methocarbamol , multivitamin with minerals, olanzapine , ondansetron , phenazopyridine , trospium , and benefiber.   Allergies: Patient has no known allergies.   Social History: Patient  reports that she has never smoked. She has never used smokeless tobacco. She reports current alcohol use of about 1.0 standard drink of alcohol per week. She reports that she does not use drugs.     OBJECTIVE     Physical Exam: There were no vitals filed for this visit.   Gen: No apparent distress, A&O x 3.  Detailed Urogynecologic Evaluation:  Deferred.       ASSESSMENT AND PLAN    [name] is a 68 y.o. with:  No diagnosis found.    There are no diagnoses linked to this encounter.  Time spent: I spent 28 minutes dedicated to the care of this patient on the date of this encounter to include pre-visit review of records, face-to-face time with the patient discussing mixed urinary incontinence, vaginal atrophy, fecal incontinence, abnormal urinalysis, and post visit documentation and  ordering medication/ testing.   Lianne ONEIDA Gillis, MD

## 2024-10-16 ENCOUNTER — Other Ambulatory Visit (HOSPITAL_COMMUNITY)
Admission: RE | Admit: 2024-10-16 | Discharge: 2024-10-16 | Disposition: A | Source: Other Acute Inpatient Hospital | Attending: Obstetrics | Admitting: Obstetrics

## 2024-10-16 ENCOUNTER — Ambulatory Visit: Admitting: Obstetrics

## 2024-10-16 ENCOUNTER — Encounter: Payer: Self-pay | Admitting: Obstetrics

## 2024-10-16 VITALS — BP 132/74 | HR 68

## 2024-10-16 DIAGNOSIS — R8290 Unspecified abnormal findings in urine: Secondary | ICD-10-CM | POA: Insufficient documentation

## 2024-10-16 DIAGNOSIS — R159 Full incontinence of feces: Secondary | ICD-10-CM | POA: Diagnosis not present

## 2024-10-16 DIAGNOSIS — N952 Postmenopausal atrophic vaginitis: Secondary | ICD-10-CM

## 2024-10-16 DIAGNOSIS — R152 Fecal urgency: Secondary | ICD-10-CM | POA: Diagnosis not present

## 2024-10-16 DIAGNOSIS — N3946 Mixed incontinence: Secondary | ICD-10-CM | POA: Diagnosis not present

## 2024-10-16 LAB — URINALYSIS, COMPLETE (UACMP) WITH MICROSCOPIC
Bilirubin Urine: NEGATIVE
Glucose, UA: NEGATIVE mg/dL
Ketones, ur: NEGATIVE mg/dL
Leukocytes,Ua: NEGATIVE
Nitrite: NEGATIVE
Protein, ur: NEGATIVE mg/dL
Specific Gravity, Urine: 1.017 (ref 1.005–1.030)
pH: 7 (ref 5.0–8.0)

## 2024-10-16 LAB — POCT URINALYSIS DIP (CLINITEK)
Bilirubin, UA: NEGATIVE
Glucose, UA: NEGATIVE mg/dL
Ketones, POC UA: NEGATIVE mg/dL
Leukocytes, UA: NEGATIVE
Nitrite, UA: NEGATIVE
POC PROTEIN,UA: NEGATIVE
Spec Grav, UA: 1.025 (ref 1.010–1.025)
Urobilinogen, UA: 0.2 U/dL
pH, UA: 7.5 (ref 5.0–8.0)

## 2024-10-16 MED ORDER — TROSPIUM CHLORIDE ER 60 MG PO CP24: 1.0000 | ORAL_CAPSULE | Freq: Every day | ORAL | 2 refills | Status: AC

## 2024-10-16 NOTE — Patient Instructions (Addendum)
 Please return with a full bladder for your urodynamic testing.   Continue vaginal estrogen 0.5g twice a week.   Continue Trospium  60mg  daily.   Please visit the website below to sign up for an account. We will need an email address to send along with your prescription to verify your prescription once you have signed up.  [email or web address]  Trospium  Chloride ER Capsule Extended Release  60mg   30 count $31.11  Or    Trospium  Chloride (2 times a day dosing) Tablet  20mg   60 count  $14.03

## 2024-10-16 NOTE — Assessment & Plan Note (Signed)
-   12/2023 POCT + heme, negative UA microscopy and culture - urgency > stress, onset after left ankle injury and surgery. Resolved after fluid management, Kegels, and vaginal estrogen. Prior concerns with history of endometrial CA. Reviewed with Dr. Viktoria and encouraged pt to resume 0.5-1g 2x/week with improvement of urinary symptoms - failed pelvic floor PT and mirabegron in the past - continue trospium  due to relief of FI. Rx changed to [facility] for cost.  - unable to quantify leakage despite pyridium  pad test, desires to proceed with urodynamics  - We discussed the symptoms of overactive bladder (OAB), which include urinary urgency, urinary frequency, nocturia, with or without urge incontinence.  While we do not know the exact etiology of OAB, several treatment options exist. We discussed management including behavioral therapy (decreasing bladder irritants, urge suppression strategies, timed voids, bladder retraining), physical therapy, medication; for refractory cases posterior tibial nerve stimulation, sacral neuromodulation, and intravesical botulinum toxin injection.  For anticholinergic medications, we discussed the potential side effects of anticholinergics including dry eyes, dry mouth, constipation, cognitive impairment and urinary retention. For Beta-3 agonist medication, we discussed the potential side effect of elevated blood pressure which is more likely to occur in individuals with uncontrolled hypertension. - gemtesa  is cost prohibitive, mirabegron not covered by insurance - continue behavioral modification with fluid management - For treatment of stress urinary incontinence,  non-surgical options include expectant management, weight loss, physical therapy, as well as a pessary.  Surgical options include a midurethral sling, Burch urethropexy, and transurethral injection of a bulking agent. - reviewed urethral bulking (Bulkamid). We discussed success rate of approximately 70-80% and  possible need for second injection. We reviewed that this is not a permanent procedure and the Bulkamid does become less effective over time. Risks reviewed including injury to bladder or urethra, UTI, urinary retention and hematuria.  - Sling: The effectiveness of a midurethral vaginal mesh sling is approximately 85%, and thus, there will be times when you may leak urine after surgery, especially if your bladder is full or if you have a strong cough. There is a balance between making the sling tight enough to treat your leakage but not too tight so that you have long-term difficulty emptying your bladder. A mesh sling will not directly treat overactive bladder/urge incontinence and may worsen it.  There is an FDA safety notification on vaginal mesh procedures for prolapse but NOT mesh slings. We have extensive experience and training with mesh placement and we have close postoperative follow up to identify any potential complications from mesh. It is important to realize that this mesh is a permanent implant that cannot be easily removed. There are rare risks of mesh exposure (2-4%), pain with intercourse (0-7%), and infection (<1%). The risk of mesh exposure if more likely in a woman with risks for poor healing (prior radiation, poorly controlled diabetes, or immunocompromised). The risk of new or worsened chronic pain after mesh implant is more common in women with baseline chronic pain and/or poorly controlled anxiety or depression. Approximately 2-4% of patients will experience longer-term post-operative voiding dysfunction that may require surgical revision of the sling. We also reviewed that postoperatively, her stream may not be as strong as before surgery.  - pt considering urethral bulking - continue Trospium  due to resolution of bowel leakage and improvement in urgency urinary leakage, reassess with 60mg  XL - pt desires to avoid 3rd line therapy for OAB

## 2024-10-16 NOTE — Assessment & Plan Note (Signed)
-   For symptomatic vaginal atrophy options include lubrication with a water-based lubricant, personal hygiene measures and barrier protection against wetness, and estrogen replacement in the form of vaginal cream, vaginal tablets, or a time-released vaginal ring.   - continue vaginal estrogen up to 1g 2x/week due to prior improvement, reviewed with Dr. Lewie team - We discussed the potential risks associated with hormone replacement including stroke, heart attack, and blood clots; and the fact that these risks are very low with vaginal estrogen use due to the very low systemic absorption rate of ~ 0.01% with a twice-week regimen.

## 2024-10-16 NOTE — Assessment & Plan Note (Signed)
-   12/25/23 POCT UA + heme, negative clean catch UA micro and culture - denies tobacco use or pelvic radiation - UA + heme/ketone/protein/leuk during norovirus 09/13/24, denied UTI symptoms - repeat UA + heme/protein, pending clean catch UA micro and culture

## 2024-10-16 NOTE — Assessment & Plan Note (Signed)
-   5 episodes/year with liquid stool. Denies symptoms since Trospium , continue to monitor with change to 60mg  once daily - reduced symptoms with benefiber in the daytime compared to night time - denies IBS, reports spinach as dietary trigger - Treatment options include anti-diarrhea medication (loperamide/ Imodium OTC or prescription lomotil), fiber supplements, physical therapy, and possible sacral neuromodulation or surgery.   - continue Kegel exercises and encouraged diary to record episodes if symptoms return - encouraged trial of IBGuard if symptoms returns

## 2024-10-17 ENCOUNTER — Ambulatory Visit: Payer: Self-pay | Admitting: Obstetrics

## 2024-10-17 LAB — URINE CULTURE: Culture: NO GROWTH

## 2024-10-23 NOTE — Progress Notes (Signed)
 Ethanol ordered to evaluate for possible intoxication that be could be causing her symptoms.  GI pathogen panel ordered to evaluate for the cause of her diarrhea

## 2024-12-02 ENCOUNTER — Encounter: Admitting: Obstetrics and Gynecology

## 2024-12-25 ENCOUNTER — Ambulatory Visit: Admitting: Obstetrics

## 2025-01-06 ENCOUNTER — Other Ambulatory Visit

## 2025-01-13 ENCOUNTER — Encounter: Admitting: Family Medicine
